# Patient Record
Sex: Female | Born: 1965 | Race: Black or African American | Hispanic: No | Marital: Single | Smoking: Former smoker
Health system: Southern US, Community
[De-identification: ages and names within clinical notes are randomized; demographics above are authoritative.]

## PROBLEM LIST (undated history)

## (undated) DIAGNOSIS — E7800 Pure hypercholesterolemia, unspecified: Secondary | ICD-10-CM

## (undated) DIAGNOSIS — G4730 Sleep apnea, unspecified: Secondary | ICD-10-CM

## (undated) DIAGNOSIS — F209 Schizophrenia, unspecified: Secondary | ICD-10-CM

## (undated) DIAGNOSIS — I10 Essential (primary) hypertension: Secondary | ICD-10-CM

## (undated) HISTORY — PX: UMBILICAL HERNIA REPAIR: SHX196

---

## 2014-02-13 ENCOUNTER — Ambulatory Visit: Payer: Self-pay | Admitting: Internal Medicine

## 2014-03-07 DIAGNOSIS — F209 Schizophrenia, unspecified: Secondary | ICD-10-CM | POA: Insufficient documentation

## 2014-03-07 DIAGNOSIS — I10 Essential (primary) hypertension: Secondary | ICD-10-CM | POA: Insufficient documentation

## 2014-03-07 DIAGNOSIS — E7800 Pure hypercholesterolemia, unspecified: Secondary | ICD-10-CM | POA: Insufficient documentation

## 2014-03-07 DIAGNOSIS — F2089 Other schizophrenia: Secondary | ICD-10-CM | POA: Insufficient documentation

## 2014-10-13 DIAGNOSIS — G4730 Sleep apnea, unspecified: Secondary | ICD-10-CM | POA: Insufficient documentation

## 2014-10-13 DIAGNOSIS — Z1211 Encounter for screening for malignant neoplasm of colon: Secondary | ICD-10-CM | POA: Insufficient documentation

## 2014-10-13 DIAGNOSIS — K219 Gastro-esophageal reflux disease without esophagitis: Secondary | ICD-10-CM | POA: Insufficient documentation

## 2014-11-14 ENCOUNTER — Encounter: Payer: Self-pay | Admitting: *Deleted

## 2014-11-17 ENCOUNTER — Ambulatory Visit: Payer: Medicaid Other | Admitting: *Deleted

## 2014-11-17 ENCOUNTER — Encounter
Admission: RE | Disposition: A | Discharge status: Home Health | Payer: Self-pay | Source: Ambulatory Visit | Attending: Gastroenterology

## 2014-11-17 ENCOUNTER — Ambulatory Visit
Admission: RE | Admit: 2014-11-17 | Discharge: 2014-11-17 | Disposition: A | Discharge status: Home Health | Payer: Medicaid Other | Source: Ambulatory Visit | Attending: Gastroenterology | Admitting: Gastroenterology

## 2014-11-17 DIAGNOSIS — G4730 Sleep apnea, unspecified: Secondary | ICD-10-CM | POA: Diagnosis not present

## 2014-11-17 DIAGNOSIS — K621 Rectal polyp: Secondary | ICD-10-CM | POA: Diagnosis not present

## 2014-11-17 DIAGNOSIS — Z1211 Encounter for screening for malignant neoplasm of colon: Secondary | ICD-10-CM | POA: Insufficient documentation

## 2014-11-17 DIAGNOSIS — K635 Polyp of colon: Secondary | ICD-10-CM | POA: Diagnosis not present

## 2014-11-17 DIAGNOSIS — Z79899 Other long term (current) drug therapy: Secondary | ICD-10-CM | POA: Diagnosis not present

## 2014-11-17 DIAGNOSIS — E669 Obesity, unspecified: Secondary | ICD-10-CM | POA: Insufficient documentation

## 2014-11-17 DIAGNOSIS — E7800 Pure hypercholesterolemia, unspecified: Secondary | ICD-10-CM | POA: Insufficient documentation

## 2014-11-17 DIAGNOSIS — Z6835 Body mass index (BMI) 35.0-35.9, adult: Secondary | ICD-10-CM | POA: Insufficient documentation

## 2014-11-17 DIAGNOSIS — F209 Schizophrenia, unspecified: Secondary | ICD-10-CM | POA: Diagnosis not present

## 2014-11-17 DIAGNOSIS — I10 Essential (primary) hypertension: Secondary | ICD-10-CM | POA: Insufficient documentation

## 2014-11-17 HISTORY — DX: Essential (primary) hypertension: I10

## 2014-11-17 HISTORY — PX: COLONOSCOPY WITH PROPOFOL: SHX5780

## 2014-11-17 HISTORY — DX: Pure hypercholesterolemia, unspecified: E78.00

## 2014-11-17 HISTORY — DX: Sleep apnea, unspecified: G47.30

## 2014-11-17 HISTORY — DX: Schizophrenia, unspecified: F20.9

## 2014-11-17 SURGERY — COLONOSCOPY WITH PROPOFOL
Anesthesia: General

## 2014-11-17 MED ORDER — SODIUM CHLORIDE 0.9 % IV SOLN: INTRAVENOUS | Status: DC

## 2014-11-17 MED ORDER — MIDAZOLAM HCL 2 MG/2ML IJ SOLN
INTRAMUSCULAR | Status: DC | PRN
  Administered 2014-11-17: 1 mg via INTRAVENOUS

## 2014-11-17 MED ORDER — PROPOFOL 500 MG/50ML IV EMUL
INTRAVENOUS | Status: DC | PRN
  Administered 2014-11-17: 120 ug/kg/min via INTRAVENOUS

## 2014-11-17 MED ORDER — SODIUM CHLORIDE 0.9 % IV SOLN
INTRAVENOUS | Status: DC
  Administered 2014-11-17: 1000 mL via INTRAVENOUS

## 2014-11-17 MED ORDER — FENTANYL CITRATE (PF) 100 MCG/2ML IJ SOLN
INTRAMUSCULAR | Status: DC | PRN
  Administered 2014-11-17: 50 ug via INTRAVENOUS

## 2014-11-17 NOTE — Transfer of Care (Signed)
Immediate Anesthesia Transfer of Care Note  Patient: Kerri Clark  Procedure(s) Performed: Procedure(s): COLONOSCOPY WITH PROPOFOL (N/A)  Patient Location: Endoscopy Unit  Anesthesia Type:General  Level of Consciousness: awake, alert , oriented and patient cooperative  Airway & Oxygen Therapy: Patient Spontanous Breathing and Patient connected to nasal cannula oxygen  Post-op Assessment: Report given to RN, Post -op Vital signs reviewed and stable and Patient moving all extremities X 4  Post vital signs: Reviewed and stable  Last Vitals:  Filed Vitals:   11/17/14 1415  BP: 134/85  Pulse: 78  Temp: 37.3 C  Resp: 20    Complications: No apparent anesthesia complications

## 2014-11-17 NOTE — Anesthesia Postprocedure Evaluation (Signed)
  Anesthesia Post-op Note  Patient: Kerri Clark  Procedure(s) Performed: Procedure(s): COLONOSCOPY WITH PROPOFOL (N/A)  Anesthesia type:General  Patient location: PACU  Post pain: Pain level controlled  Post assessment: Post-op Vital signs reviewed, Patient's Cardiovascular Status Stable, Respiratory Function Stable, Patent Airway and No signs of Nausea or vomiting  Post vital signs: Reviewed and stable  Last Vitals:  Filed Vitals:   11/17/14 1606  BP: 117/95  Pulse: 65  Temp:   Resp: 16    Level of consciousness: awake, alert  and patient cooperative  Complications: No apparent anesthesia complications

## 2014-11-17 NOTE — Anesthesia Preprocedure Evaluation (Signed)
Anesthesia Evaluation  Patient identified by MRN, date of birth, ID band Patient awake    Reviewed: Allergy & Precautions, NPO status , Patient's Chart, lab work & pertinent test results  Airway Mallampati: II  TM Distance: >3 FB Neck ROM: Full    Dental  (+) Teeth Intact   Pulmonary sleep apnea ,  Not using CPAP as it irritates her skin.   Pulmonary exam normal        Cardiovascular Exercise Tolerance: Good hypertension, Pt. on medications Normal cardiovascular exam     Neuro/Psych    GI/Hepatic negative GI ROS,   Endo/Other    Renal/GU      Musculoskeletal   Abdominal (+) + obese,   Peds  Hematology   Anesthesia Other Findings   Reproductive/Obstetrics                             Anesthesia Physical Anesthesia Plan  ASA: III  Anesthesia Plan: General   Post-op Pain Management:    Induction: Intravenous  Airway Management Planned: Nasal Cannula  Additional Equipment:   Intra-op Plan:   Post-operative Plan:   Informed Consent: I have reviewed the patients History and Physical, chart, labs and discussed the procedure including the risks, benefits and alternatives for the proposed anesthesia with the patient or authorized representative who has indicated his/her understanding and acceptance.     Plan Discussed with: CRNA  Anesthesia Plan Comments:         Anesthesia Quick Evaluation

## 2014-11-17 NOTE — Op Note (Addendum)
[facility] Patient Name: Kerri Clark Procedure Date: 11/17/2014 3:09 PM MRN: [id number] Account #: [id number] Date of Birth: 08-11-1965 Admit Type: Outpatient Age: 49 Room: [facility] ENDO ROOM 3 Gender: Female Note Status: Supervisor Override Procedure:         Colonoscopy Indications:       Screening for colorectal malignant neoplasm, This is the                     patient's first colonoscopy Providers:         Christena Deem, MD Referring MD:      Kerri Loveless, MD (Referring MD) Medicines:         Monitored Anesthesia Care Complications:     No immediate complications. Procedure:         Pre-Anesthesia Assessment:                    - ASA Grade Assessment: III - A patient with severe                     systemic disease.                    After obtaining informed consent, the colonoscope was                     passed under direct vision. Throughout the procedure, the                     patient's blood pressure, pulse, and oxygen saturations                     were monitored continuously. The Colonoscope was                     introduced through the anus and advanced to the the cecum,                     identified by appendiceal orifice and ileocecal valve. The                     quality of the bowel preparation was good. The colonoscopy                     was performed without difficulty. The patient tolerated                     the procedure well. Findings:      Two sessile polyps were found in the sigmoid colon. The polyps were 2 mm       in size. These polyps were removed with a cold biopsy forceps. Resection       and retrieval were complete.      Three sessile polyps were found in the distal sigmoid colon. The polyps       were 1 to 3 mm in size. These polyps were removed with a cold biopsy       forceps. Resection and retrieval were complete.      Four sessile polyps were found in the rectum. The polyps were 1 to 3 mm     in size. These polyps were removed with a cold biopsy forceps. Resection       and retrieval were complete.      The exam was otherwise normal throughout the examined colon.  The digital rectal exam was normal. Impression:        - Two 2 mm polyps in the sigmoid colon. Resected and                     retrieved.                    - Three 1 to 3 mm polyps in the distal sigmoid colon.                     Resected and retrieved.                    - Four 1 to 3 mm polyps in the rectum. Resected and                     retrieved. Recommendation:    - Await pathology results.                    - Telephone GI clinic for pathology results in 1 week. Procedure Code(s): --- Professional ---                    [phone], Colonoscopy, flexible; with biopsy, single or                     multiple Diagnosis Code(s): --- Professional ---                    V76.51, Special screening for malignant neoplasms of colon                    211.3, Benign neoplasm of colon                    569.0, Anal and rectal polyp CPT copyright 2014 American Medical Association. All rights reserved. The codes documented in this report are preliminary and upon coder review may  be revised to meet current compliance requirements. Christena DeemMartin U Skulskie, MD 11/17/2014 3:40:38 PM This report has been signed electronically. Number of Addenda: 0 Note Initiated On: 11/17/2014 3:09 PM Scope Withdrawal Time: 0 hours 14 minutes 59 seconds  Total Procedure Duration: 0 hours 22 minutes 32 seconds       [facility]

## 2014-11-17 NOTE — H&P (Signed)
Outpatient short stay form Pre-procedure 11/17/2014 3:04 PM Kerri DeemMartin U Skulskie MD  Primary Physician: Dr. Yves DillNeelam Khan  Reason for visit:  Screening colonoscopy  History of present illness:  Patient is a 49 year old female presenting today for her first colonoscopy. She tolerated her prep well. She takes no aspirin products or blood thinners.    Current facility-administered medications:  .  0.9 %  sodium chloride infusion, , Intravenous, Continuous, Kerri DeemMartin U Skulskie, MD, Last Rate: 20 mL/hr at 11/17/14 1413, 1,000 mL at 11/17/14 1413 .  0.9 %  sodium chloride infusion, , Intravenous, Continuous, Kerri DeemMartin U Skulskie, MD  Prescriptions prior to admission  Medication Sig Dispense Refill Last Dose  . amLODipine (NORVASC) 10 MG tablet Take 10 mg by mouth daily.   11/17/2014 at 630am  . diphenhydrAMINE (BENADRYL) 50 MG tablet Take 50 mg by mouth daily at 10 pm.   11/16/2014  . lisinopril (PRINIVIL,ZESTRIL) 5 MG tablet Take 5 mg by mouth daily.   11/17/2014 at 630am  . lurasidone (LATUDA) 80 MG TABS tablet Take 80 mg by mouth daily with breakfast.   11/16/2014 at 100pm  . pediatric multivitamin-fluoride (POLY-VI-FLOR) 0.25 MG chewable tablet Chew 1 tablet by mouth daily.   11/17/2014 at Unknown time     No Known Allergies   Past Medical History  Diagnosis Date  . Hypercholesterolemia   . Hypertension   . Schizophrenia (HCC)   . Sleep apnea     Review of systems:      Physical Exam    Heart and lungs: Regular rate and rhythm without rub or gallop, lungs are bilaterally clear    HEENT: Normocephalic atraumatic eyes are anicteric    Other:     Pertinant exam for procedure: Soft nontender nondistended bowel sounds positive normoactive    Planned proceedures: Colonoscopy and indicated procedures. I have discussed the risks benefits and complications of procedures to include not limited to bleeding, infection, perforation and the risk of sedation and the patient wishes to  proceed.    Kerri DeemMartin U Skulskie, MD Gastroenterology 11/17/2014  3:04 PM

## 2014-11-18 ENCOUNTER — Encounter: Payer: Self-pay | Admitting: Gastroenterology

## 2014-11-19 LAB — SURGICAL PATHOLOGY

## 2015-02-03 ENCOUNTER — Other Ambulatory Visit: Payer: Self-pay | Admitting: Internal Medicine

## 2015-02-03 DIAGNOSIS — Z1231 Encounter for screening mammogram for malignant neoplasm of breast: Secondary | ICD-10-CM

## 2015-02-23 ENCOUNTER — Ambulatory Visit: Payer: Medicaid Other

## 2015-02-24 ENCOUNTER — Ambulatory Visit
Admission: RE | Admit: 2015-02-24 | Discharge: 2015-02-24 | Disposition: A | Discharge status: Home / Self Care | Payer: Medicaid Other | Source: Ambulatory Visit | Attending: Internal Medicine | Admitting: Internal Medicine

## 2015-02-24 DIAGNOSIS — Z1231 Encounter for screening mammogram for malignant neoplasm of breast: Secondary | ICD-10-CM | POA: Insufficient documentation

## 2015-03-02 ENCOUNTER — Encounter: Payer: Self-pay | Admitting: Dietician

## 2015-03-02 ENCOUNTER — Encounter: Payer: Medicaid Other | Attending: Internal Medicine | Admitting: Dietician

## 2015-03-02 VITALS — Ht 63.0 in | Wt 208.7 lb

## 2015-03-02 DIAGNOSIS — E669 Obesity, unspecified: Secondary | ICD-10-CM | POA: Diagnosis present

## 2015-03-02 DIAGNOSIS — R7303 Prediabetes: Secondary | ICD-10-CM | POA: Insufficient documentation

## 2015-03-02 DIAGNOSIS — I10 Essential (primary) hypertension: Secondary | ICD-10-CM | POA: Diagnosis present

## 2015-03-02 NOTE — Patient Instructions (Signed)
   Eat a small meal or snack every 4-5 hours during the day. Make sure to have some protein and healthy carb; such as oatmeal and boiled eggs, or whole wheat sandwich with peanut butter, or fruit and lowfat cheese, or yogurt.   Control food portions: fist-size or less of starchy foods, palm-size or less for meats, but have generous portions of vegetables.    Drink sugar free drinks. You can add Equal or other sweetener instead of sugar in drinks.   If you want to try bottled green tea, make sure it's diet. Also drink G2 Gatorade.   Keep up with exercising 3 or more times a week.

## 2015-03-02 NOTE — Progress Notes (Signed)
Medical Nutrition Therapy: Visit start time: 1330  end time: 1430  Assessment:  Diagnosis: pre-Diabetes, HTN, obesity Past medical history: sleep apnea, frequent heartburn  Psychosocial issues/ stress concerns: scizophrenia; patient reports low stress level Preferred learning method:  . Auditory  Current weight: 208.7lbs with jacket and shoes  Height: 5'3" Medications, supplements: reviewed list in chart with patient  Progress and evaluation: Patient reports recent weight gain, due to peri-menopause and medications; reports weight of 201 about 2-3 weeks ago.          Has started going to gym           Has decreased fried foods, use of butter         Boyfriend does a lot of the cooking, but tends to fry foods more.  Physical activity: walking/ cardio at gym for 30-45 minutes, 3-4 times a week  Dietary Intake:  Usual eating pattern includes 1-2 meals and 1-2 snacks per day. Dining out frequency: 2-3 meals per week.  Breakfast: often oatmeal. Rarely big breakfast eggs, toast, grits, sausage. May be as late as 12. Sometimes drinks coffee first.  Snack: none Lunch: none or eats breakfast late Snack: cookies, cheez-its, chips (stopped for 2 years due to BP) Supper: 2:30-3pm fish (fried), steak, potatoes, mac and cheese, vegetables: collards, salad Snack: none; stopped eating ice cream and other sweets Beverages: water, coffee, some pepsi or gatorade.   Nutrition Care Education: Topics covered: diabetes prevention, weight manageent Basic nutrition: basic food groups, appropriate nutrient balance, appropriate meal and snack schedule, general nutrition guidelines    Weight control: benefits of weight control, behavioral changes for weight loss: food portions, basic meal planning using plate method and menus.  Diabetes: appropriate carb intake and balance, importance of sugar free beverages, controlling portions of starches and choosing high-fiber foods.  Hypertension: identifying high  sodium foods, identifying food sources of potassium, magnesium Other lifestyle changes:  benefits of tracking food intake  Nutritional Diagnosis:  [place]-3.3 Overweight/obesity As related to history of excess calories, inactivity, perimenopause, medications.  As evidenced by patient report, high BMI.  Intervention: Instruction as noted above.   Set goals with patient input.    Patient has been making some changes to decrease calories.    Patient is ready to work on additional diet changes if not too overwhelming.  Education Materials given:  . General diet guidelines for Diabetes . Food lists/ Planning A Balanced Meal . Sample meal pattern/ menus: Quick and Healthy Meal Ideas . Plate method meal planner . Goals/ instructions  Learner/ who was taught:  . Patient   Level of understanding: . Partial understanding; needs review/ practice  Demonstrated degree of understanding via:   Teach back Learning barriers: . None  Willingness to learn/ readiness for change: . Eager, change in progress  Monitoring and Evaluation:  Dietary intake, exercise, BP and BG control, and body weight      follow up: 04/06/15

## 2015-04-06 ENCOUNTER — Encounter: Payer: Medicaid Other | Attending: Internal Medicine | Admitting: Dietician

## 2015-04-06 VITALS — Wt 202.6 lb

## 2015-04-06 DIAGNOSIS — I10 Essential (primary) hypertension: Secondary | ICD-10-CM | POA: Diagnosis present

## 2015-04-06 DIAGNOSIS — R7303 Prediabetes: Secondary | ICD-10-CM | POA: Insufficient documentation

## 2015-04-06 DIAGNOSIS — E669 Obesity, unspecified: Secondary | ICD-10-CM | POA: Insufficient documentation

## 2015-04-06 NOTE — Progress Notes (Signed)
Medical Nutrition Therapy: Visit start time: 5110  end time: 1400   Assessment:  Diagnosis: HTN, pre-diabetes, obesity Medical history changes: no changes Psychosocial issues/ stress concerns: scizophrenia  Current weight: 202.6lbs  Height: 5'3" Medications, supplement changes: no changes Progress and evaluation: Weight loss of 6.1lbs since previous appt. On 03/02/15.           Patient reports better portion control, and avoiding sugary beverages.         She states she sometimes uses her fiance's BP monitor to test BP, with results usually 120s/60-80s.          She also reports significant improvement in heartburn and is feeling much better.            Physical activity: gym for 45-50 minutes 2-3 times per week; walking average 4 times per week  Dietary Intake:  Usual eating pattern includes 3 meals and 1-2 snacks per day. Dining out frequency: not assessed today  Breakfast: sugar free, low sodium oatmeal, 2packs with 1 boiled egg. Today 2 eggs with cheese and 2slices toast.  Snack: yogurt or pudding once a day, or a few cheez-its, rarely chips Lunch: salad, using less dressing now. Cucumber, shredded cheese, lettuce, garlic croutons, french and caesar dressing. 3/19 spaghetti Snack: some sugar free jello with fruit cocktail, occasionally small pudding cup.  Supper: 2pcs chicken wings, whole small can green veg, mac and cheese. Eating 1/2 of restaurant meal when dining out.  Snack: none Beverages: no sugary drinks since last RD visit' mostly water, some G2 gatorade.   Nutrition Care Education: Topics covered: weight management, Hypertension Weight control: benefits of weight control such as improved BPs and heartburn.  Diabetes: appropriate carb intake and balance Hypertension: identifying food sources of Calcium, potassium, magnesium  Nutritional Diagnosis:  [place]-3.3 Overweight/obesity As related to history of excess caloric intake.  As evidenced by patient report.  Intervention:  Discussion as noted above.   Patient has met goals established at previous visit.    Set new goals with her input.    Commended patient for changes made thus far.    Set follow-up appt for September, patient wished to return after about 6 months.  Education Materials given:  Marland Kitchen Goals/ instructions  Learner/ who was taught:  . Patient   Level of understanding: Marland Kitchen Verbalizes/ demonstrates competency  Demonstrated degree of understanding via:   Teach back Learning barriers: . None  Willingness to learn/ readiness for change: . Eager, change in progress  Monitoring and Evaluation:  Dietary intake, exercise, BP control, and body weight      follow up: 10/05/15

## 2015-04-06 NOTE — Patient Instructions (Signed)
   Try some whole grain cereal, such as Special K, Wheaties, Mini wheats, cheerios, or bran cereal.   You can mix Special K or cheerios with bran buds or Bran flakes to increase the fiber.   Keep eating plenty of vegetables each day, good job!  Include some fruit 3 times a day. You can try finding [employer] oranges [facility] for a sweet, full-flavor orange.   Keep up your great work!

## 2015-10-05 ENCOUNTER — Ambulatory Visit: Payer: Medicaid Other | Admitting: Dietician

## 2015-11-23 ENCOUNTER — Encounter: Payer: Medicaid Other | Attending: Internal Medicine | Admitting: Dietician

## 2015-11-23 VITALS — Ht 63.0 in | Wt 197.7 lb

## 2015-11-23 DIAGNOSIS — Z6835 Body mass index (BMI) 35.0-35.9, adult: Secondary | ICD-10-CM

## 2015-11-23 DIAGNOSIS — E669 Obesity, unspecified: Secondary | ICD-10-CM | POA: Diagnosis not present

## 2015-11-23 DIAGNOSIS — R7303 Prediabetes: Secondary | ICD-10-CM | POA: Insufficient documentation

## 2015-11-23 DIAGNOSIS — Z713 Dietary counseling and surveillance: Secondary | ICD-10-CM | POA: Insufficient documentation

## 2015-11-23 DIAGNOSIS — E6609 Other obesity due to excess calories: Secondary | ICD-10-CM

## 2015-11-23 DIAGNOSIS — I10 Essential (primary) hypertension: Secondary | ICD-10-CM | POA: Insufficient documentation

## 2015-11-23 NOTE — Progress Notes (Signed)
Medical Nutrition Therapy: Visit start time: 1315  end time: 1345  Assessment:  Diagnosis: obesity, pre-diabetes Medical history changes: no changes per patient Psychosocial issues/ stress concerns: none; taking medication for scizophrenia  Current weight: 197.7lbs  Height: 5'3" Medications, supplement changes: no changes per patient  Progress and evaluation: Weight loss of about 5lbs since previous visit on 04/06/15. She feels some discouragement over slow weight loss, but also reports decrease in physical activity in recent months. She has also had some desserts recently, but has now stopped. She reports trying new foods more easily, she has developed a taste for leafy greens and some fruits.   Physical activity: has recently resumed gym visits 2-3 times a week. Will plan to resume walking on alternate days.   Dietary Intake:  Usual eating pattern includes 3 meals and 1 snacks per day. Dining out frequency: 0-1 meals per week twice in past month.  Breakfast: yogurt with blueberries, Wheaties with 2%milk, sometimes almond milk Snack: usually none Lunch: burger with chili and slaw; salads,  Snack: chips and dip, occasionally lemon meringue pie Supper: varies; beans, rice, chicken, vegetables, pasta; salad and lasagna at cafeteria Snack: sometimes  Beverages: water, coffee with breakfast  Nutrition Care Education: Topics covered: weight management Basic nutrition: reviewed appropriate nutrient balance Weight control: determining reasonable weight goal, benefits of tracking food intake, role of physical activity in weight management. Reviewed patient's progress since 04/06/15.   Nutritional Diagnosis:  [place]-3.3 Overweight/obesity As related to history of excess calories, effects of medications, inactivity.  As evidenced by patient report.  Intervention: Discussion as noted above.   Commended patient for progress made and encouraged her to give herself credit for losing 11lbs since 03/02/15.     Will contact patient to schedule follow-up after checking on insurance coverage.    Patient would benefit from regular monthly RD visits.   Education Materials given:  Marland Kitchen. Goals/ instructions  Learner/ who was taught:  . Patient   Level of understanding: Marland Kitchen. Verbalizes/ demonstrates competency  Demonstrated degree of understanding via:   Teach back Learning barriers: . None  Willingness to learn/ readiness for change: . Eager, change in progress  Monitoring and Evaluation:  Dietary intake, exercise, and body weight      follow up: to be determined

## 2015-11-23 NOTE — Patient Instructions (Signed)
   Keep a diary of what you eat and how much you eat  Goal for portions: 1/3 - 1 cup of starches (1 handful up to 1 fist-size)    Palm-size (or less) portions of meats    Generous portions of low-carb vegetables.  Keep up your regular exercise, including some daily walking.

## 2016-01-19 ENCOUNTER — Other Ambulatory Visit: Payer: Self-pay | Admitting: Internal Medicine

## 2016-03-08 ENCOUNTER — Other Ambulatory Visit: Payer: Self-pay | Admitting: Internal Medicine

## 2016-03-08 DIAGNOSIS — Z1231 Encounter for screening mammogram for malignant neoplasm of breast: Secondary | ICD-10-CM

## 2016-03-17 DIAGNOSIS — I2089 Other forms of angina pectoris: Secondary | ICD-10-CM | POA: Insufficient documentation

## 2016-03-17 DIAGNOSIS — I208 Other forms of angina pectoris: Secondary | ICD-10-CM | POA: Insufficient documentation

## 2016-04-01 ENCOUNTER — Ambulatory Visit
Admission: RE | Admit: 2016-04-01 | Discharge: 2016-04-01 | Disposition: A | Discharge status: Home / Self Care | Payer: Medicaid Other | Source: Ambulatory Visit | Attending: Internal Medicine | Admitting: Internal Medicine

## 2016-04-01 DIAGNOSIS — Z1231 Encounter for screening mammogram for malignant neoplasm of breast: Secondary | ICD-10-CM | POA: Insufficient documentation

## 2016-12-05 ENCOUNTER — Ambulatory Visit: Payer: Self-pay | Admitting: Internal Medicine

## 2017-04-28 ENCOUNTER — Other Ambulatory Visit: Payer: Self-pay | Admitting: Internal Medicine

## 2017-04-28 DIAGNOSIS — Z1231 Encounter for screening mammogram for malignant neoplasm of breast: Secondary | ICD-10-CM

## 2017-05-17 ENCOUNTER — Ambulatory Visit
Admission: RE | Admit: 2017-05-17 | Discharge: 2017-05-17 | Disposition: A | Discharge status: Home / Self Care | Payer: Medicaid Other | Source: Ambulatory Visit | Attending: Internal Medicine | Admitting: Internal Medicine

## 2017-05-17 DIAGNOSIS — Z1231 Encounter for screening mammogram for malignant neoplasm of breast: Secondary | ICD-10-CM | POA: Diagnosis present

## 2018-04-09 ENCOUNTER — Ambulatory Visit: Payer: Medicaid Other | Admitting: Podiatry

## 2018-04-09 ENCOUNTER — Ambulatory Visit (INDEPENDENT_AMBULATORY_CARE_PROVIDER_SITE_OTHER): Payer: Medicaid Other

## 2018-04-09 ENCOUNTER — Other Ambulatory Visit: Payer: Self-pay

## 2018-04-09 ENCOUNTER — Other Ambulatory Visit: Payer: Self-pay | Admitting: Podiatry

## 2018-04-09 ENCOUNTER — Encounter: Payer: Self-pay | Admitting: Podiatry

## 2018-04-09 ENCOUNTER — Ambulatory Visit (INDEPENDENT_AMBULATORY_CARE_PROVIDER_SITE_OTHER): Payer: Medicaid Other | Admitting: Podiatry

## 2018-04-09 DIAGNOSIS — M722 Plantar fascial fibromatosis: Secondary | ICD-10-CM

## 2018-04-09 DIAGNOSIS — M7742 Metatarsalgia, left foot: Secondary | ICD-10-CM

## 2018-04-09 NOTE — Progress Notes (Signed)
This patient presents the office with chief complaint of painful feet for approximately 3 to 4 months.  She says that her left foot is worse than her right foot.  She points to the area over the metatarsals on the left foot at the site of her severe pain she says that the pain worsens with activity.  She denies any history of trauma or injury to the foot.  She has provided no self treatment nor sought any professional help.  She presents the office today for an evaluation and treatment of her painful feet.  Vascular  Dorsalis pedis and posterior tibial pulses are palpable  B/L.  Capillary return  WNL.  Temperature gradient is  WNL.  Skin turgor  WNL  Sensorium  Senn Weinstein monofilament wire  WNL. Normal tactile sensation.  Nail Exam  Patient has normal nails with no evidence of bacterial or fungal infection.  Orthopedic  Exam  Muscle tone and muscle strength  WNL.  No limitations of motion feet  B/L.  No crepitus or joint effusion noted.  Foot type is unremarkable and digits show no abnormalities.  No evidence of any redness or swelling noted on the left forefoot.  No localized palpable pain noted through the metatarsals on the left and right foot.  Patient does have a flexible pes planus foot type on the left foot greater in the right foot.  Skin  No open lesions.  Normal skin texture and turgor.  Metatarsalgia  B/L  IE.  X-rays were taken revealed no evidence of any bony pathology both feet.  Discussed this condition with this patient.  I told her that there is no evidence of any bony pathology at this time.  I recommended power step insoles which she did not take.  I also told her she should take medication to help with her pain in the p.m.Marland Kitchen  Patient to return to the office in the future.  RTC prn.  Helane Gunther DPM

## 2018-04-10 ENCOUNTER — Encounter: Payer: Self-pay | Admitting: Podiatry

## 2018-04-26 ENCOUNTER — Other Ambulatory Visit: Payer: Self-pay | Admitting: Internal Medicine

## 2018-04-26 DIAGNOSIS — Z1231 Encounter for screening mammogram for malignant neoplasm of breast: Secondary | ICD-10-CM

## 2018-07-02 ENCOUNTER — Ambulatory Visit
Admission: RE | Admit: 2018-07-02 | Discharge: 2018-07-02 | Disposition: A | Discharge status: Home / Self Care | Payer: Medicaid Other | Source: Ambulatory Visit | Attending: Internal Medicine | Admitting: Internal Medicine

## 2018-07-02 ENCOUNTER — Other Ambulatory Visit: Payer: Self-pay

## 2018-07-02 DIAGNOSIS — Z1231 Encounter for screening mammogram for malignant neoplasm of breast: Secondary | ICD-10-CM | POA: Diagnosis not present

## 2019-05-31 ENCOUNTER — Other Ambulatory Visit: Payer: Self-pay | Admitting: Family

## 2019-05-31 DIAGNOSIS — Z1231 Encounter for screening mammogram for malignant neoplasm of breast: Secondary | ICD-10-CM

## 2019-07-04 ENCOUNTER — Encounter: Payer: Self-pay | Admitting: Radiology

## 2019-07-04 ENCOUNTER — Ambulatory Visit
Admission: RE | Admit: 2019-07-04 | Discharge: 2019-07-04 | Disposition: A | Discharge status: Home / Self Care | Payer: Medicaid Other | Source: Ambulatory Visit | Attending: Family | Admitting: Family

## 2019-07-04 DIAGNOSIS — Z1231 Encounter for screening mammogram for malignant neoplasm of breast: Secondary | ICD-10-CM | POA: Diagnosis present

## 2020-05-26 ENCOUNTER — Other Ambulatory Visit: Payer: Self-pay | Admitting: Family

## 2020-05-26 DIAGNOSIS — Z1231 Encounter for screening mammogram for malignant neoplasm of breast: Secondary | ICD-10-CM

## 2020-07-06 ENCOUNTER — Other Ambulatory Visit: Payer: Self-pay

## 2020-07-06 ENCOUNTER — Ambulatory Visit
Admission: RE | Admit: 2020-07-06 | Discharge: 2020-07-06 | Disposition: A | Discharge status: Home / Self Care | Payer: Medicaid Other | Source: Ambulatory Visit | Attending: Family | Admitting: Family

## 2020-07-06 DIAGNOSIS — Z1231 Encounter for screening mammogram for malignant neoplasm of breast: Secondary | ICD-10-CM | POA: Diagnosis present

## 2020-12-03 ENCOUNTER — Other Ambulatory Visit (INDEPENDENT_AMBULATORY_CARE_PROVIDER_SITE_OTHER): Payer: Self-pay | Admitting: Nurse Practitioner

## 2020-12-03 DIAGNOSIS — I739 Peripheral vascular disease, unspecified: Secondary | ICD-10-CM

## 2020-12-04 ENCOUNTER — Ambulatory Visit (INDEPENDENT_AMBULATORY_CARE_PROVIDER_SITE_OTHER): Payer: Medicaid Other

## 2020-12-04 ENCOUNTER — Ambulatory Visit (INDEPENDENT_AMBULATORY_CARE_PROVIDER_SITE_OTHER): Payer: Medicaid Other | Admitting: Nurse Practitioner

## 2020-12-04 ENCOUNTER — Other Ambulatory Visit: Payer: Self-pay

## 2020-12-04 VITALS — BP 135/90 | HR 84 | Ht 63.0 in | Wt 203.0 lb

## 2020-12-04 DIAGNOSIS — I739 Peripheral vascular disease, unspecified: Secondary | ICD-10-CM | POA: Diagnosis not present

## 2020-12-04 DIAGNOSIS — M7989 Other specified soft tissue disorders: Secondary | ICD-10-CM

## 2020-12-07 ENCOUNTER — Encounter (INDEPENDENT_AMBULATORY_CARE_PROVIDER_SITE_OTHER): Payer: Self-pay | Admitting: Nurse Practitioner

## 2020-12-07 NOTE — Progress Notes (Addendum)
Subjective:    Patient ID: Kerri Clark, female    DOB: 26-Apr-1965, 55 y.o.   MRN: [id number] Chief Complaint  Patient presents with   New Patient (Initial Visit)    NP faxed/[name] [phone] PVD and Edema in BLE     Kerri Clark is a 55 year old female that is seen  for evaluation of leg swelling. The patient first noticed the swelling remotely but is now concerned because of a significant increase in the overall edema. The swelling is associated with pain and discoloration. The patient notes that in the morning the legs are significantly improved but they steadily worsened throughout the course of the day.  There is no history of ulcerations associated with the swelling.   The patient denies any recent changes in their medications.  The patient has been wearing graduated compression, but no elevation  The patient has no had any past angiography, interventions or vascular surgery.  The patient denies a history of DVT or PE. There is no prior history of phlebitis. There is no history of primary lymphedema.  There is no history of radiation treatment to the groin or pelvis No history of malignancies. No history of trauma or groin or pelvic surgery. No history of foreign travel or parasitic infections area   Today the patient has an ABI of 1.14 on the left and one-point 3 0 on the right.  Triphasic tibial artery waveforms bilaterally with good toe waveforms bilaterally.     Review of Systems  Cardiovascular:  Positive for leg swelling.  All other systems reviewed and are negative.     Objective:   Physical Exam Vitals reviewed.  HENT:     Head: Normocephalic.  Cardiovascular:     Rate and Rhythm: Normal rate.     Pulses: Normal pulses.  Pulmonary:     Effort: Pulmonary effort is normal.  Musculoskeletal:     Right lower leg: 1+ Edema present.     Left lower leg: 1+ Edema present.  Skin:    General: Skin is warm and dry.  Neurological:     Mental  Status: She is alert and oriented to person, place, and time.  Psychiatric:        Mood and Affect: Mood normal.        Behavior: Behavior normal.        Thought Content: Thought content normal.        Judgment: Judgment normal.    BP 135/90   Pulse 84   Ht 5\' 3"  (1.6 m)   Wt 203 lb (92.1 kg)   LMP 11/17/2014   BMI 35.96 kg/m   Past Medical History:  Diagnosis Date   Hypercholesterolemia    Hypertension    Schizophrenia (HCC)    Sleep apnea     Social History   Socioeconomic History   Marital status: Single    Spouse name: Not on file   Number of children: Not on file   Years of education: Not on file   Highest education level: Not on file  Occupational History   Not on file  Tobacco Use   Smoking status: Former    Types: Cigarettes    Quit date: 01/17/2003    Years since quitting: 17.9   Smokeless tobacco: Not on file  Substance and Sexual Activity   Alcohol use: No    Alcohol/week: 0.0 standard drinks   Drug use: No   Sexual activity: Not on file  Other Topics Concern  Not on file  Social History Narrative   Not on file   Social Determinants of Health   Financial Resource Strain: Not on file  Food Insecurity: Not on file  Transportation Needs: Not on file  Physical Activity: Not on file  Stress: Not on file  Social Connections: Not on file  Intimate Partner Violence: Not on file    Past Surgical History:  Procedure Laterality Date   COLONOSCOPY WITH PROPOFOL N/A 11/17/2014   Procedure: COLONOSCOPY WITH PROPOFOL;  Surgeon: Christena Deem, MD;  Location: [facility] ENDOSCOPY;  Service: Endoscopy;  Laterality: N/A;   UMBILICAL HERNIA REPAIR      Family History  Problem Relation Age of Onset   Breast cancer Other     Allergies  Allergen Reactions   Atorvastatin Other (See Comments)   Rosuvastatin     Note: muscle cramps    No flowsheet data found.    CMP  No results found for: NA, K, CL, CO2, GLUCOSE, BUN, CREATININE, CALCIUM, PROT,  ALBUMIN, AST, ALT, ALKPHOS, BILITOT, GFRNONAA, GFRAA   No results found.     Assessment & Plan:   1. Leg swelling I have had a long discussion with the patient regarding swelling and why it  causes symptoms.  Patient will begin wearing graduated compression stockings class 1 (20-30 mmHg) on a daily basis a prescription was given. The patient will  beginning wearing the stockings first thing in the morning and removing them in the evening. The patient is instructed specifically not to sleep in the stockings.   In addition, behavioral modification will be initiated.  This will include frequent elevation, use of over the counter pain medications and exercise such as walking.  I have reviewed systemic causes for chronic edema such as liver, kidney and cardiac etiologies.  The patient denies problems with these organ systems.    Consideration for a lymph pump will also be made based upon the effectiveness of conservative therapy.  This would help to improve the edema control and prevent sequela such as ulcers and infections   Patient should undergo duplex ultrasound of the venous system to ensure that DVT or reflux is not present.  The patient will follow-up with me after the ultrasound.     Current Outpatient Medications on File Prior to Visit  Medication Sig Dispense Refill   amLODipine (NORVASC) 10 MG tablet Take 10 mg by mouth daily.     Bempedoic Acid-Ezetimibe (NEXLIZET) 180-10 MG TABS Nexlizet 180-10 MG Oral Tablet QTY: 90 tablet Days: 90 Refills: 2  Written: 12/02/19 Patient Instructions: 1 po qd     Calcium Carbonate-Vit D-Min (CALTRATE 600+D PLUS MINERALS) 600-800 MG-UNIT CHEW Caltrate 600+D Plus Minerals 600-800 MG-UNIT Oral Tablet Chewable QTY: 0 tablet Days: 0 Refills: 0  Written: 04/19/18 Patient Instructions:     cyanocobalamin 100 MCG tablet Vitamin B12 100 MCG Oral Tablet QTY: 0 tablet Days: 0 Refills: 0  Written: 07/26/16 Patient Instructions: qd     diphenhydrAMINE  (BENADRYL) 50 MG tablet Take 50 mg by mouth daily at 10 pm.     lisinopril (PRINIVIL,ZESTRIL) 5 MG tablet Take 5 mg by mouth daily.     lurasidone (LATUDA) 80 MG TABS tablet Take 80 mg by mouth daily with breakfast.     Multiple Vitamins-Minerals (MULTIVITAMIN ADULTS 50+) TABS Take by mouth.     omeprazole (PRILOSEC) 20 MG capsule Omeprazole 20 MG Oral Capsule Delayed Release QTY: 90 capsule Days: 90 Refills: 3  Written: 12/02/19 Patient Instructions: TAKE 1  CAPSULE  BY MOUTH EVERY MORNING, 30 MINUTES BEFORE ANY FOOD     pediatric multivitamin-fluoride (POLY-VI-FLOR) 0.25 MG chewable tablet Chew 1 tablet by mouth daily. Reported on 03/02/2015     polyethylene glycol powder (GLYCOLAX/MIRALAX) 17 GM/SCOOP powder As directed for colonic prep.     terbinafine (LAMISIL) 250 MG tablet TAKE ONE TABLET BY MOUTH EVERY DAY FOR 3 MONTHS  2   No current facility-administered medications on file prior to visit.    There are no Patient Instructions on file for this visit. No follow-ups on file.   Georgiana Spinner, NP

## 2021-03-12 ENCOUNTER — Other Ambulatory Visit: Payer: Self-pay

## 2021-03-12 ENCOUNTER — Ambulatory Visit (INDEPENDENT_AMBULATORY_CARE_PROVIDER_SITE_OTHER): Payer: Medicaid Other

## 2021-03-12 ENCOUNTER — Ambulatory Visit (INDEPENDENT_AMBULATORY_CARE_PROVIDER_SITE_OTHER): Payer: Medicaid Other | Admitting: Vascular Surgery

## 2021-03-12 ENCOUNTER — Other Ambulatory Visit (INDEPENDENT_AMBULATORY_CARE_PROVIDER_SITE_OTHER): Payer: Self-pay | Admitting: Nurse Practitioner

## 2021-03-12 VITALS — BP 135/84 | HR 81 | Ht 63.0 in | Wt 205.0 lb

## 2021-03-12 DIAGNOSIS — M7989 Other specified soft tissue disorders: Secondary | ICD-10-CM

## 2021-03-12 DIAGNOSIS — I10 Essential (primary) hypertension: Secondary | ICD-10-CM | POA: Diagnosis not present

## 2021-03-12 DIAGNOSIS — I890 Lymphedema, not elsewhere classified: Secondary | ICD-10-CM | POA: Diagnosis not present

## 2021-03-12 DIAGNOSIS — E7800 Pure hypercholesterolemia, unspecified: Secondary | ICD-10-CM | POA: Diagnosis not present

## 2021-03-12 NOTE — Assessment & Plan Note (Signed)
Venous study today demonstrated no evidence of deep venous thrombosis, superficial thrombophlebitis, or significant venous reflux in the left lower extremity. The patient has been diligently wearing her 20 to 30 mmHg compression socks, elevating her legs, and trying to be active for over 3 months now.  This has resulted in minimal improvement in her left lower extremity swelling.  She does not have significant venous disease.  She has stage II lymphedema with refractory swelling given to compression and elevation.  I would recommend a lymphedema pump for adjuvant therapy at this point.  I have discussed the pathophysiology and natural history of lymphedema.  We will try to obtain the pump and see her back in about 6 months.

## 2021-03-12 NOTE — Assessment & Plan Note (Signed)
blood pressure control important in reducing the progression of atherosclerotic disease. On appropriate oral medications.  

## 2021-03-12 NOTE — Assessment & Plan Note (Signed)
lipid control important in reducing the progression of atherosclerotic disease. Continue statin therapy  

## 2021-03-12 NOTE — Progress Notes (Signed)
MRN : [id number]  Kerri Clark is a 56 y.o. (January 13, 1966) female who presents with chief complaint of  Chief Complaint  Patient presents with   Follow-up    3 Mo F/U LLE ven REFLUX   .  History of Present Illness: Patient returns today in follow up of her left leg swelling.  She has been diligently wearing her compression stockings for about 3 months.  These may help slightly with swelling, but not significantly.  No open wounds or infection.  No fevers or chills.  Venous study today demonstrated no evidence of deep venous thrombosis, superficial thrombophlebitis, or significant venous reflux in the left lower extremity.  Current Outpatient Medications  Medication Sig Dispense Refill   amLODipine (NORVASC) 10 MG tablet Take 10 mg by mouth daily.     Bempedoic Acid-Ezetimibe (NEXLIZET) 180-10 MG TABS Nexlizet 180-10 MG Oral Tablet QTY: 90 tablet Days: 90 Refills: 2  Written: 12/02/19 Patient Instructions: 1 po qd     diphenhydrAMINE (BENADRYL) 50 MG tablet Take 50 mg by mouth daily at 10 pm.     lisinopril (PRINIVIL,ZESTRIL) 5 MG tablet Take 5 mg by mouth daily.     lurasidone (LATUDA) 80 MG TABS tablet Take 80 mg by mouth daily with breakfast.     Multiple Vitamins-Minerals (MULTIVITAMIN ADULTS 50+) TABS Take by mouth.     omeprazole (PRILOSEC) 20 MG capsule Omeprazole 20 MG Oral Capsule Delayed Release QTY: 90 capsule Days: 90 Refills: 3  Written: 12/02/19 Patient Instructions: TAKE 1 CAPSULE  BY MOUTH EVERY MORNING, 30 MINUTES BEFORE ANY FOOD     Calcium Carbonate-Vit D-Min (CALTRATE 600+D PLUS MINERALS) 600-800 MG-UNIT CHEW Caltrate 600+D Plus Minerals 600-800 MG-UNIT Oral Tablet Chewable QTY: 0 tablet Days: 0 Refills: 0  Written: 04/19/18 Patient Instructions: (Patient not taking: Reported on 03/12/2021)     cyanocobalamin 100 MCG tablet Vitamin B12 100 MCG Oral Tablet QTY: 0 tablet Days: 0 Refills: 0  Written: 07/26/16 Patient Instructions: qd (Patient not taking: Reported on  03/12/2021)     pediatric multivitamin-fluoride (POLY-VI-FLOR) 0.25 MG chewable tablet Chew 1 tablet by mouth daily. Reported on 03/02/2015 (Patient not taking: Reported on 03/12/2021)     polyethylene glycol powder (GLYCOLAX/MIRALAX) 17 GM/SCOOP powder As directed for colonic prep. (Patient not taking: Reported on 03/12/2021)     terbinafine (LAMISIL) 250 MG tablet TAKE ONE TABLET BY MOUTH EVERY DAY FOR 3 MONTHS (Patient not taking: Reported on 03/12/2021)  2   No current facility-administered medications for this visit.    Past Medical History:  Diagnosis Date   Hypercholesterolemia    Hypertension    Schizophrenia (HCC)    Sleep apnea     Past Surgical History:  Procedure Laterality Date   COLONOSCOPY WITH PROPOFOL N/A 11/17/2014   Procedure: COLONOSCOPY WITH PROPOFOL;  Surgeon: Christena Deem, MD;  Location: [facility] ENDOSCOPY;  Service: Endoscopy;  Laterality: N/A;   UMBILICAL HERNIA REPAIR       Social History   Tobacco Use   Smoking status: Former    Types: Cigarettes    Quit date: 01/17/2003    Years since quitting: 18.1  Substance Use Topics   Alcohol use: No    Alcohol/week: 0.0 standard drinks   Drug use: No       Family History  Problem Relation Age of Onset   Breast cancer Other      Allergies  Allergen Reactions   Atorvastatin Other (See Comments)   Rosuvastatin     Note:  muscle cramps     REVIEW OF SYSTEMS (Negative unless checked)  Constitutional: [] Weight loss  [] Fever  [] Chills Cardiac: [] Chest pain   [] Chest pressure   [] Palpitations   [] Shortness of breath when laying flat   [] Shortness of breath at rest   [] Shortness of breath with exertion. Vascular:  [] Pain in legs with walking   [] Pain in legs at rest   [] Pain in legs when laying flat   [] Claudication   [] Pain in feet when walking  [] Pain in feet at rest  [] Pain in feet when laying flat   [] History of DVT   [] Phlebitis   [x] Swelling in legs   [] Varicose veins   [] Non-healing  ulcers Pulmonary:   [] Uses home oxygen   [] Productive cough   [] Hemoptysis   [] Wheeze  [] COPD   [] Asthma Neurologic:  [] Dizziness  [] Blackouts   [] Seizures   [] History of stroke   [] History of TIA  [] Aphasia   [] Temporary blindness   [] Dysphagia   [] Weakness or numbness in arms   [] Weakness or numbness in legs Musculoskeletal:  [x] Arthritis   [] Joint swelling   [x] Joint pain   [] Low back pain Hematologic:  [] Easy bruising  [] Easy bleeding   [] Hypercoagulable state   [] Anemic   Gastrointestinal:  [] Blood in stool   [] Vomiting blood  [] Gastroesophageal reflux/heartburn   [] Abdominal pain Genitourinary:  [] Chronic kidney disease   [] Difficult urination  [] Frequent urination  [] Burning with urination   [] Hematuria Skin:  [] Rashes   [] Ulcers   [] Wounds Psychological:  [] History of anxiety   []  History of major depression.  Physical Examination  BP 135/84    Pulse 81    Ht 5\' 3"  (1.6 m)    Wt 205 lb (93 kg)    LMP 11/17/2014    BMI 36.31 kg/m  Gen:  WD/WN, NAD Head: [place]/AT, No temporalis wasting. Ear/Nose/Throat: Hearing grossly intact, nares w/o erythema or drainage Eyes: Conjunctiva clear. Sclera non-icteric Neck: Supple.  Trachea midline Pulmonary:  Good air movement, no use of accessory muscles.  Cardiac: RRR, no JVD Vascular:  Vessel Right Left  Radial Palpable Palpable           Musculoskeletal: M/S 5/5 throughout.  No deformity or atrophy. 2+ LLE edema. Neurologic: Sensation grossly intact in extremities.  Symmetrical.  Speech is fluent.  Psychiatric: Judgment intact, Mood & affect appropriate for pt's clinical situation. Dermatologic: No rashes or ulcers noted.  No cellulitis or open wounds.      Labs No results found for this or any previous visit (from the past 2160 hour(s)).  Radiology No results found.  Assessment/Plan  Lymphedema Venous study today demonstrated no evidence of deep venous thrombosis, superficial thrombophlebitis, or significant venous reflux in the  left lower extremity. The patient has been diligently wearing her 20 to 30 mmHg compression socks, elevating her legs, and trying to be active for over 3 months now.  This has resulted in minimal improvement in her left lower extremity swelling.  She does not have significant venous disease.  She has stage II lymphedema with refractory swelling given to compression and elevation.  I would recommend a lymphedema pump for adjuvant therapy at this point.  I have discussed the pathophysiology and natural history of lymphedema.  We will try to obtain the pump and see her back in about 6 months.  Pure hypercholesterolemia lipid control important in reducing the progression of atherosclerotic disease. Continue statin therapy   Essential (primary) hypertension blood pressure control important in reducing the progression of  atherosclerotic disease. On appropriate oral medications.    Leotis Pain, MD  03/12/2021 11:17 AM    This note was created with Dragon medical transcription system.  Any errors from dictation are purely unintentional

## 2021-04-20 ENCOUNTER — Telehealth (INDEPENDENT_AMBULATORY_CARE_PROVIDER_SITE_OTHER): Payer: Self-pay | Admitting: Vascular Surgery

## 2021-04-20 NOTE — Telephone Encounter (Signed)
I spoke with the patient and recommended her to contact [employer] to check on the status for the lymphedema pump ?

## 2021-04-20 NOTE — Telephone Encounter (Signed)
Patient was last seen on 03/12/21 and states two days after appointment, someone came out to measure her legs for leg cuffs and she have not heard anything since.  She would like to know what is going on with the cuffs.  Please advise. ?

## 2021-05-28 ENCOUNTER — Other Ambulatory Visit: Payer: Self-pay | Admitting: Family

## 2021-05-28 DIAGNOSIS — Z1231 Encounter for screening mammogram for malignant neoplasm of breast: Secondary | ICD-10-CM

## 2021-06-03 ENCOUNTER — Encounter: Payer: Medicaid Other | Attending: Internal Medicine | Admitting: Dietician

## 2021-06-03 NOTE — Patient Instructions (Addendum)
Limit or avoid deep fried foods; use only a small amount of cooking oil to saute in a skillet, or use cooking spray.  Choose mostly chicken, [place], or fish as meat choices. Have pork or beef only occasionally; pork tenderloin, or flank steak, sirloin are lean beef options. If you have ground beef, choose 93% lean. Increase vegetables; plan to have a generous portion with lunch and dinner meals. Try mixing vegetables  Try some Mrs Deliah Boston or Jess Barters salt free seasonings to decrease use of seasoning salt. Start with at least a few minutes of exercise most days of the week. Schedule it into a daily routine.

## 2021-06-03 NOTE — Progress Notes (Signed)
Medical Nutrition Therapy: Visit start time: 1330  end time: 1430  Assessment:  Diagnosis: HTN, hyperlipidemia, pre-diabetes, overweight Past medical history: sleep apnea, GERD  Psychosocial issues/ stress concerns: schizophrenia  Preferred learning method:  Auditory Visual   Current weight: 202.4lbs Height: 5'3" BMI: 35.85  Medications, supplements: reconciled list in medical record. Takes MVI daily  Progress and evaluation:  Patient reports she has been looking into healthy recipes for smoothies to boost energy; has a few with some fruit but low sugar, added protein powder and fiber source. Has ordered a blender.  She feels she needs to increase fiber intake including veg ie cauliflower, brussels sprouts.   Her goal is to lose to about 173lbs, ideally in 1 year, but realistically will be content with good progress.  Trying to find partner for support in making lifestyle changes.   Physical activity: has elliptical, bike, stair stepper, weights in exercise room in her home. No energy recently, so no regular exercise  Dietary Intake:  Usual eating pattern includes 3 meals and 1-2 snacks per day. Dining out frequency: 0-1 meals per week. 3-5 times per month.  Breakfast: first green tea, then 2 hours later oatmeal with blueberries and raspberries; occ yogurt and blueberries Snack: none or same as pm Lunch: likes hot dogs with chili and slaw -- plans to eliminate; deli meat with cheese, lettuce and tomato on honey wheat bread. Snack: trail mix; cheese-its, potato chips; PB/ cheese crackers; granola bar/ protein bar Supper: varies-- 5/17 2 pcs chicken, 2 pcs bread Snack: none Beverages: water 8+ cups daily; green tea; occ soda ie cheerwine or pepsi or ginger ale  Intervention:   Nutrition Care Education:   Basic nutrition: appropriate nutrient balance; appropriate meal and snack schedule; general nutrition guidelines    Weight control: importance of low sugar and low fat choices,  appropriate food portions, estimated energy needs for weight loss at 1300 kcal, provided guidance for 45% CHO, 25% pro, 30% fat; discussed incorporating physical activity Advanced nutrition: food label reading for carbohydrate Pre-Diabetes:  appropriate meal and snack schedule, appropriate carb intake and balance, healthy carb choices, role of fiber, protein, fat Hypertension: identifying high sodium foods, low sodium/ salt free seasoning options Hyperlipidemia:  healthy and unhealthy fats, lean protein sources, importance of limiting fat in dairy, butter, creamy sauces;   Nutritional Diagnosis:  [place]-2.1 Inpaired nutrition utilization and [place]-2.2 Altered nutrition-related laboratory As related to hypertension, hyperlipidemia, pre-diabetes.  As evidenced by blood pressure and blood lipids controlled with medications, and elevated HbA1C. [place]-3.3 Overweight/obesity As related to excess calories and inadequate physical activity.  As evidenced by patient with current BMI of 35.85, working on dietary changes to promote weight loss and reduce health risk.   Education Materials given:  Designer, industrial/product with food lists, sample meal pattern Foods to Choose to Lower Cholesterol (AHA) Good Fats, Bad Fats (AHA) Visit summary with goals/ instructions   Learner/ who was taught:  Patient   Level of understanding: Verbalizes/ demonstrates competency  Demonstrated degree of understanding via:   Teach back Learning barriers: None  Willingness to learn/ readiness for change: Eager, change in progress   Monitoring and Evaluation:  Dietary intake, exercise, diabetes and lipid labs, and body weight      follow up:  07/15/21 at 1:15pm

## 2021-07-08 ENCOUNTER — Ambulatory Visit
Admission: RE | Admit: 2021-07-08 | Discharge: 2021-07-08 | Disposition: A | Discharge status: Home / Self Care | Payer: Medicaid Other | Source: Ambulatory Visit | Attending: Family | Admitting: Family

## 2021-07-08 DIAGNOSIS — Z1231 Encounter for screening mammogram for malignant neoplasm of breast: Secondary | ICD-10-CM | POA: Diagnosis present

## 2021-07-15 ENCOUNTER — Ambulatory Visit: Payer: Medicaid Other | Admitting: Dietician

## 2021-07-29 ENCOUNTER — Encounter: Payer: Self-pay | Admitting: Dietician

## 2021-07-29 ENCOUNTER — Encounter: Payer: Medicaid Other | Attending: Internal Medicine | Admitting: Dietician

## 2021-07-29 VITALS — Ht 63.0 in | Wt 196.9 lb

## 2021-07-29 DIAGNOSIS — E7800 Pure hypercholesterolemia, unspecified: Secondary | ICD-10-CM | POA: Diagnosis present

## 2021-07-29 DIAGNOSIS — I10 Essential (primary) hypertension: Secondary | ICD-10-CM | POA: Diagnosis present

## 2021-07-29 DIAGNOSIS — R7303 Prediabetes: Secondary | ICD-10-CM | POA: Diagnosis present

## 2021-07-29 DIAGNOSIS — Z713 Dietary counseling and surveillance: Secondary | ICD-10-CM | POA: Insufficient documentation

## 2021-07-29 DIAGNOSIS — Z6834 Body mass index (BMI) 34.0-34.9, adult: Secondary | ICD-10-CM | POA: Insufficient documentation

## 2021-07-29 DIAGNOSIS — E669 Obesity, unspecified: Secondary | ICD-10-CM | POA: Diagnosis not present

## 2021-07-29 NOTE — Patient Instructions (Signed)
Great job making healthy food choices, keep it up! Gradually increase exercise to further help with weight loss and improving health risk.

## 2021-07-29 NOTE — Progress Notes (Signed)
Medical Nutrition Therapy: Visit start time: 1445  end time: 1515  Assessment:  Diagnosis: prediabetes, HTN, hyperlipidemia, overweight Medical history changes: no changes  Psychosocial issues/ stress concerns: schizophrenia  Medications, supplement changes: no changes per patient   Current weight: 196.9lbs Height: 5'3" BMI: 34.88  Progress and evaluation:  Limits chips, eating more fruit, peanut butter for snacks.  Has stopped eating hot dogs, no sodas since previous visit (06/03/21) Weight has decreased by about 6lbs since previous visit. Patient feels she needs to improve on frequency and duration of exercise    Dietary Intake:  Usual eating pattern includes 2-3 meals and 1-2 snacks per day. Dining out frequency: 0-1 times per week  Breakfast: green tea, then yogurt and berries; 6 crackers whole wheat with peanut butter and apple Snack: none Lunch: no hot dogs; cobb salad; sandwich with [place] and/or ham, cheese, tomato and lettuce; turkey/ salmon/ lean ground beef + veg Snack: baked chips; almonds;  Supper: sometimes combines lunch and dinner lean meat/ fish + veg Snack: none or same as pm Beverages: water, green tea; Splash flavored water  Physical activity: sporadic elliptical, stair stepper  Intervention:   Nutrition Care Education:  Basic nutrition: reviewed basic food groups; appropriate nutrient balance    Weight control: reviewed progress since previous visit; effects of exercise on weight control ie inches vs lbs; increasing duration and frequency gradually Diabetes:  reviewed appropriate meal and snack schedule; appropriate carb intake and balance; effects of stress, physical activity Other:  making gradual changes; changing 1-2 habits at a time  Other Intervention Notes: Commended patient for positive changes made. She is confident she can continue with healthier eating pattern. Her focus now will be on increasing physical activity. She does not feel she needs  any additional MNT follow up; she will schedule later if situation changes.  Nutritional Diagnosis:  [place]-2.1 Inpaired nutrition utilization and [place]-2.2 Altered nutrition-related laboratory As related to hypertension, hyperlipidemia, pre-diabetes.  As evidenced by blood pressure and blood lipids controlled with medications, and elevated HbA1C. [place]-3.3 Overweight/obesity As related to excess calories and inadequate physical activity.  As evidenced by patient with current BMI of 35.85, working on dietary changes to promote weight loss and reduce health risk.   Education Materials given:  Geographical information systems officer who was taught:  Patient   Level of understanding: Verbalizes/ demonstrates competency   Demonstrated degree of understanding via:   Teach back Learning barriers: None  Willingness to learn/ readiness for change: Eager, change in progress   Monitoring and Evaluation:  Dietary intake, exercise, BG and BP control, blood lipids, and body weight      follow up: prn

## 2021-09-09 ENCOUNTER — Ambulatory Visit (INDEPENDENT_AMBULATORY_CARE_PROVIDER_SITE_OTHER): Payer: Medicaid Other | Admitting: Nurse Practitioner

## 2021-10-25 ENCOUNTER — Ambulatory Visit (LOCAL_COMMUNITY_HEALTH_CENTER): Payer: Medicaid Other

## 2021-10-25 DIAGNOSIS — Z23 Encounter for immunization: Secondary | ICD-10-CM | POA: Diagnosis not present

## 2021-10-25 NOTE — Progress Notes (Signed)
  Are you feeling sick today? No   Have you ever received a dose of COVID-19 Vaccine? [employer], [place], [place], [place], Other) Yes  If yes, which vaccine and how many doses?   PFIZER, 5   Did you bring the vaccination record card or other documentation?  Yes   Do you have a health condition or are undergoing treatment that makes you moderately or severely immunocompromised? This would include, but not be limited to: cancer, HIV, organ transplant, immunosuppressive therapy/high-dose corticosteroids, or moderate/severe primary immunodeficiency.  No  Have you received COVID-19 vaccine before or during hematopoietic cell transplant (HCT) or CAR-T-cell therapies? No  Have you ever had an allergic reaction to: (This would include a severe allergic reaction or a reaction that caused hives, swelling, or respiratory distress, including wheezing.) A component of a COVID-19 vaccine or a previous dose of COVID-19 vaccine? No   Have you ever had an allergic reaction to another vaccine (other thanCOVID-19 vaccine) or an injectable medication? (This would include a severe allergic reaction or a reaction that caused hives, swelling, or respiratory distress, including wheezing.)   No    Do you have a history of any of the following:  Myocarditis or Pericarditis No  Dermal fillers:  No  Multisystem Inflammatory Syndrome (MIS-C or MIS-A)? No  COVID-19 disease within the past 3 months? No  Vaccinated with monkeypox vaccine in the last 4 weeks? No  Pt was in clinic requested flu and Pfizer Covid vaccine. Eligible and administered, tolerated well. Verbalized understanding of VIS, [facility] copy and covid vaccination record card. M.Larcenia Holaday, LPN.

## 2022-02-21 ENCOUNTER — Other Ambulatory Visit: Payer: Self-pay | Admitting: Internal Medicine

## 2022-05-17 ENCOUNTER — Ambulatory Visit: Payer: Medicaid Other | Admitting: Family

## 2022-05-24 ENCOUNTER — Telehealth: Payer: Self-pay | Admitting: Internal Medicine

## 2022-05-24 NOTE — Telephone Encounter (Signed)
Entered in error

## 2022-05-26 ENCOUNTER — Ambulatory Visit: Payer: Self-pay | Admitting: Family

## 2022-05-26 ENCOUNTER — Ambulatory Visit: Payer: Medicaid Other | Admitting: Family

## 2022-05-26 ENCOUNTER — Encounter: Payer: Self-pay | Admitting: Family

## 2022-05-26 ENCOUNTER — Ambulatory Visit (INDEPENDENT_AMBULATORY_CARE_PROVIDER_SITE_OTHER): Payer: Medicaid Other

## 2022-05-26 VITALS — BP 127/78 | HR 86 | Ht 63.0 in | Wt 191.8 lb

## 2022-05-26 DIAGNOSIS — F209 Schizophrenia, unspecified: Secondary | ICD-10-CM

## 2022-05-26 DIAGNOSIS — K219 Gastro-esophageal reflux disease without esophagitis: Secondary | ICD-10-CM

## 2022-05-26 DIAGNOSIS — E7800 Pure hypercholesterolemia, unspecified: Secondary | ICD-10-CM | POA: Diagnosis not present

## 2022-05-26 DIAGNOSIS — M25511 Pain in right shoulder: Secondary | ICD-10-CM | POA: Diagnosis not present

## 2022-05-26 DIAGNOSIS — E538 Deficiency of other specified B group vitamins: Secondary | ICD-10-CM

## 2022-05-26 DIAGNOSIS — I890 Lymphedema, not elsewhere classified: Secondary | ICD-10-CM | POA: Diagnosis not present

## 2022-05-26 DIAGNOSIS — R7303 Prediabetes: Secondary | ICD-10-CM

## 2022-05-26 DIAGNOSIS — R5383 Other fatigue: Secondary | ICD-10-CM

## 2022-05-26 DIAGNOSIS — E559 Vitamin D deficiency, unspecified: Secondary | ICD-10-CM

## 2022-05-26 DIAGNOSIS — I10 Essential (primary) hypertension: Secondary | ICD-10-CM | POA: Diagnosis not present

## 2022-05-26 MED ORDER — LISINOPRIL 10 MG PO TABS: 10.0000 mg | ORAL_TABLET | Freq: Every day | ORAL | 11 refills | Status: DC

## 2022-05-26 NOTE — Progress Notes (Signed)
Lm informing pt

## 2022-05-26 NOTE — Progress Notes (Signed)
Established Patient Office Visit  Subjective:  Patient ID: Kerri Clark, female    DOB: 1965-11-22  Age: 57 y.o. MRN: [id number]  Chief Complaint  Patient presents with   Follow-up    6 month follow up    Patient is here for her 6 month follow up.   Stopped her cholesterol meds on April 18th, says she has been having issues with her right shoulder.  She originally thought that it could have been from weight lifting.  She also asks if she can come off of the amlodipine, says she thinks it's causing some of the swelling in her legs.   She does have lymphedema, and she has been elevating legs, but she still could benefit from compression stockings.   Due for labs.   No other concerns at this time.   Past Medical History:  Diagnosis Date   Hypercholesterolemia    Hypertension    Schizophrenia (HCC)    Sleep apnea     Past Surgical History:  Procedure Laterality Date   COLONOSCOPY WITH PROPOFOL N/A 11/17/2014   Procedure: COLONOSCOPY WITH PROPOFOL;  Surgeon: Christena Deem, MD;  Location: [facility] ENDOSCOPY;  Service: Endoscopy;  Laterality: N/A;   UMBILICAL HERNIA REPAIR      Social History   Socioeconomic History   Marital status: Single    Spouse name: Not on file   Number of children: Not on file   Years of education: Not on file   Highest education level: Not on file  Occupational History   Not on file  Tobacco Use   Smoking status: Former    Types: Cigarettes    Quit date: 01/17/2003    Years since quitting: 19.3   Smokeless tobacco: Not on file  Substance and Sexual Activity   Alcohol use: No    Alcohol/week: 0.0 standard drinks of alcohol   Drug use: No   Sexual activity: Not on file  Other Topics Concern   Not on file  Social History Narrative   Not on file   Social Determinants of Health   Financial Resource Strain: Not on file  Food Insecurity: Not on file  Transportation Needs: Not on file  Physical Activity: Not on file  Stress: Not  on file  Social Connections: Not on file  Intimate Partner Violence: Not on file    Family History  Problem Relation Age of Onset   Breast cancer Neg Hx     Allergies  Allergen Reactions   Atorvastatin Other (See Comments)   Rosuvastatin     Note: muscle cramps    Review of Systems  Cardiovascular:  Positive for leg swelling.  Musculoskeletal:  Positive for joint pain.  All other systems reviewed and are negative.      Objective:   BP 127/78   Pulse 86   Ht 5\' 3"  (1.6 m)   Wt 191 lb 12.8 oz (87 kg)   LMP 11/16/2014   SpO2 97%   BMI 33.98 kg/m   Vitals:   05/26/22 0941  BP: 127/78  Pulse: 86  Height: 5\' 3"  (1.6 m)  Weight: 191 lb 12.8 oz (87 kg)  SpO2: 97%  BMI (Calculated): 33.98    Physical Exam Vitals and nursing note reviewed.  Constitutional:      Appearance: Normal appearance. She is normal weight.  HENT:     Head: Normocephalic.  Eyes:     Pupils: Pupils are equal, round, and reactive to light.  Cardiovascular:  Rate and Rhythm: Normal rate.     Pulses: Normal pulses.  Pulmonary:     Effort: Pulmonary effort is normal.  Musculoskeletal:     Right shoulder: Tenderness present. Decreased range of motion.     Right lower leg: 1+ Pitting Edema present.     Left lower leg: 1+ Pitting Edema present.  Neurological:     General: No focal deficit present.     Mental Status: She is alert and oriented to person, place, and time.      No results found for any visits on 05/26/22.  No results found for this or any previous visit (from the past 2160 hour(s)).     Assessment & Plan:   Problem List Items Addressed This Visit       Active Problems   GERD (gastroesophageal reflux disease)   Relevant Orders   CBC With Differential   CMP14+EGFR   Pure hypercholesterolemia   Relevant Medications   lisinopril (ZESTRIL) 10 MG tablet   Other Relevant Orders   Lipid panel   CBC With Differential   CMP14+EGFR   Lymphedema   Relevant Orders    CBC With Differential   CMP14+EGFR   Other Visit Diagnoses     Essential hypertension, benign    -  Primary   Relevant Medications   lisinopril (ZESTRIL) 10 MG tablet   Other Relevant Orders   CBC With Differential   CMP14+EGFR   B12 deficiency due to diet       Relevant Orders   CBC With Differential   CMP14+EGFR   Vitamin B12   Prediabetes       Relevant Orders   CBC With Differential   CMP14+EGFR   Hemoglobin A1c   Vitamin D deficiency, unspecified       Relevant Orders   VITAMIN D 25 Hydroxy (Vit-D Deficiency, Fractures)   CBC With Differential   CMP14+EGFR   Other fatigue       Relevant Orders   CBC With Differential   CMP14+EGFR   TSH   Acute pain of right shoulder       Relevant Orders   DG Shoulder Right   CBC With Differential   CMP14+EGFR       Return in about 3 months (around 08/26/2022) for F/U.   Total time spent: 30 minutes  Miki Kins, FNP  05/26/2022

## 2022-05-27 LAB — CBC WITH DIFFERENTIAL
Basophils Absolute: 0 10*3/uL (ref 0.0–0.2)
Basos: 1 %
EOS (ABSOLUTE): 0.2 10*3/uL (ref 0.0–0.4)
Eos: 4 %
Hematocrit: 39.3 % (ref 34.0–46.6)
Hemoglobin: 12.7 g/dL (ref 11.1–15.9)
Immature Grans (Abs): 0 10*3/uL (ref 0.0–0.1)
Immature Granulocytes: 0 %
Lymphocytes Absolute: 1.6 10*3/uL (ref 0.7–3.1)
Lymphs: 37 %
MCH: 26.2 pg — ABNORMAL LOW (ref 26.6–33.0)
MCHC: 32.3 g/dL (ref 31.5–35.7)
MCV: 81 fL (ref 79–97)
Monocytes Absolute: 0.4 10*3/uL (ref 0.1–0.9)
Monocytes: 9 %
Neutrophils Absolute: 2.1 10*3/uL (ref 1.4–7.0)
Neutrophils: 49 %
RBC: 4.84 x10E6/uL (ref 3.77–5.28)
RDW: 14.2 % (ref 11.7–15.4)
WBC: 4.3 10*3/uL (ref 3.4–10.8)

## 2022-05-27 LAB — CMP14+EGFR
ALT: 17 IU/L (ref 0–32)
AST: 20 IU/L (ref 0–40)
Albumin/Globulin Ratio: 1.5 (ref 1.2–2.2)
Albumin: 4.1 g/dL (ref 3.8–4.9)
Alkaline Phosphatase: 104 IU/L (ref 44–121)
BUN/Creatinine Ratio: 14 (ref 9–23)
BUN: 14 mg/dL (ref 6–24)
Bilirubin Total: 0.3 mg/dL (ref 0.0–1.2)
CO2: 22 mmol/L (ref 20–29)
Calcium: 9.9 mg/dL (ref 8.7–10.2)
Chloride: 100 mmol/L (ref 96–106)
Creatinine, Ser: 1.02 mg/dL — ABNORMAL HIGH (ref 0.57–1.00)
Globulin, Total: 2.8 g/dL (ref 1.5–4.5)
Glucose: 88 mg/dL (ref 70–99)
Potassium: 4.4 mmol/L (ref 3.5–5.2)
Sodium: 135 mmol/L (ref 134–144)
Total Protein: 6.9 g/dL (ref 6.0–8.5)
eGFR: 64 mL/min/{1.73_m2} (ref >59)

## 2022-05-27 LAB — LIPID PANEL
Chol/HDL Ratio: 4.4 ratio (ref 0.0–4.4)
Cholesterol, Total: 210 mg/dL — ABNORMAL HIGH (ref 100–199)
HDL: 48 mg/dL (ref >39)
LDL Chol Calc (NIH): 148 mg/dL — ABNORMAL HIGH (ref 0–99)
Triglycerides: 77 mg/dL (ref 0–149)
VLDL Cholesterol Cal: 14 mg/dL (ref 5–40)

## 2022-05-27 LAB — TSH: TSH: 1.37 u[IU]/mL (ref 0.450–4.500)

## 2022-05-27 LAB — VITAMIN B12: Vitamin B-12: 875 pg/mL (ref 232–1245)

## 2022-05-27 LAB — HEMOGLOBIN A1C
Est. average glucose Bld gHb Est-mCnc: 126 mg/dL
Hgb A1c MFr Bld: 6 % — ABNORMAL HIGH (ref 4.8–5.6)

## 2022-05-27 LAB — VITAMIN D 25 HYDROXY (VIT D DEFICIENCY, FRACTURES): Vit D, 25-Hydroxy: 46.2 ng/mL (ref 30.0–100.0)

## 2022-06-01 ENCOUNTER — Other Ambulatory Visit: Payer: Self-pay | Admitting: Family

## 2022-06-01 ENCOUNTER — Telehealth: Payer: Self-pay | Admitting: Family

## 2022-06-01 DIAGNOSIS — Z1231 Encounter for screening mammogram for malignant neoplasm of breast: Secondary | ICD-10-CM

## 2022-06-01 NOTE — Telephone Encounter (Signed)
Patient called in inquiring about her lab results from last week. Please advise.

## 2022-06-03 ENCOUNTER — Telehealth: Payer: Self-pay

## 2022-06-03 MED ORDER — EZETIMIBE 10 MG PO TABS: 10.0000 mg | ORAL_TABLET | Freq: Every day | ORAL | 1 refills | Status: DC

## 2022-06-03 NOTE — Addendum Note (Signed)
Addended by: Grayling Congress on: 06/03/2022 02:47 PM   Modules accepted: Orders

## 2022-06-03 NOTE — Telephone Encounter (Signed)
Patient called back and doesn't want to change medication. She looked up the side effects of suggested medication and they are worse than what she was taking. She will resume her old medication because she has a supply of it.

## 2022-06-27 ENCOUNTER — Other Ambulatory Visit: Payer: Self-pay | Admitting: Internal Medicine

## 2022-07-11 ENCOUNTER — Ambulatory Visit
Admission: RE | Admit: 2022-07-11 | Discharge: 2022-07-11 | Disposition: A | Discharge status: Home / Self Care | Payer: Medicaid Other | Source: Ambulatory Visit | Attending: Family | Admitting: Family

## 2022-07-11 DIAGNOSIS — Z1231 Encounter for screening mammogram for malignant neoplasm of breast: Secondary | ICD-10-CM | POA: Diagnosis present

## 2022-08-26 ENCOUNTER — Ambulatory Visit: Payer: MEDICAID | Admitting: Family

## 2022-08-26 VITALS — BP 148/92 | HR 83 | Ht 63.0 in | Wt 185.0 lb

## 2022-08-26 DIAGNOSIS — F209 Schizophrenia, unspecified: Secondary | ICD-10-CM

## 2022-08-26 DIAGNOSIS — R5383 Other fatigue: Secondary | ICD-10-CM

## 2022-08-26 DIAGNOSIS — I10 Essential (primary) hypertension: Secondary | ICD-10-CM

## 2022-08-26 DIAGNOSIS — E7800 Pure hypercholesterolemia, unspecified: Secondary | ICD-10-CM | POA: Diagnosis not present

## 2022-08-26 DIAGNOSIS — R7303 Prediabetes: Secondary | ICD-10-CM

## 2022-08-26 DIAGNOSIS — E538 Deficiency of other specified B group vitamins: Secondary | ICD-10-CM | POA: Diagnosis not present

## 2022-08-26 DIAGNOSIS — I890 Lymphedema, not elsewhere classified: Secondary | ICD-10-CM

## 2022-08-26 DIAGNOSIS — E559 Vitamin D deficiency, unspecified: Secondary | ICD-10-CM

## 2022-08-26 DIAGNOSIS — I2089 Other forms of angina pectoris: Secondary | ICD-10-CM

## 2022-08-26 NOTE — Progress Notes (Unsigned)
Established Patient Office Visit  Subjective:  Patient ID: Kerri Clark, female    DOB: 01/14/1966  Age: 57 y.o. MRN: [id number]  Chief Complaint  Patient presents with   Follow-up    3 Months Follow up    Patient is here today for her 6 months follow up.  She has been feeling fairly well since last appointment.   She does not have additional concerns to discuss today.  Labs are due today. She needs refills.   I have reviewed her active problem list, medication list, allergies, notes from last encounter, lab results for her appointment today.    No other concerns at this time.   Past Medical History:  Diagnosis Date   Hypercholesterolemia    Hypertension    Schizophrenia (HCC)    Sleep apnea     Past Surgical History:  Procedure Laterality Date   COLONOSCOPY WITH PROPOFOL N/A 11/17/2014   Procedure: COLONOSCOPY WITH PROPOFOL;  Surgeon: Christena Deem, MD;  Location: [facility] ENDOSCOPY;  Service: Endoscopy;  Laterality: N/A;   UMBILICAL HERNIA REPAIR      Social History   Socioeconomic History   Marital status: Single    Spouse name: Not on file   Number of children: Not on file   Years of education: Not on file   Highest education level: Not on file  Occupational History   Not on file  Tobacco Use   Smoking status: Former    Current packs/day: 0.00    Types: Cigarettes    Quit date: 01/17/2003    Years since quitting: 19.6   Smokeless tobacco: Not on file  Substance and Sexual Activity   Alcohol use: No    Alcohol/week: 0.0 standard drinks of alcohol   Drug use: No   Sexual activity: Not on file  Other Topics Concern   Not on file  Social History Narrative   Not on file   Social Determinants of Health   Financial Resource Strain: Not on file  Food Insecurity: Not on file  Transportation Needs: Not on file  Physical Activity: Not on file  Stress: Not on file  Social Connections: Not on file  Intimate Partner Violence: Not on file     Family History  Problem Relation Age of Onset   Breast cancer Neg Hx     Allergies  Allergen Reactions   Atorvastatin Other (See Comments)   Rosuvastatin     Note: muscle cramps    Review of Systems  All other systems reviewed and are negative.      Objective:   BP (!) 148/92   Pulse 83   Ht 5\' 3"  (1.6 m)   Wt 185 lb (83.9 kg)   LMP 11/16/2014   SpO2 98%   BMI 32.77 kg/m   Vitals:   08/26/22 0935  BP: (!) 148/92  Pulse: 83  Height: 5\' 3"  (1.6 m)  Weight: 185 lb (83.9 kg)  SpO2: 98%  BMI (Calculated): 32.78    Physical Exam Vitals and nursing note reviewed.  Constitutional:      Appearance: Normal appearance. She is normal weight.  HENT:     Head: Normocephalic.  Eyes:     Extraocular Movements: Extraocular movements intact.     Conjunctiva/sclera: Conjunctivae normal.     Pupils: Pupils are equal, round, and reactive to light.  Cardiovascular:     Rate and Rhythm: Normal rate.  Pulmonary:     Effort: Pulmonary effort is normal.  Neurological:  General: No focal deficit present.     Mental Status: She is alert and oriented to person, place, and time. Mental status is at baseline.  Psychiatric:        Attention and Perception: Attention and perception normal.        Mood and Affect: Mood normal. Affect is blunt (likely 2/2 her medications).        Behavior: Behavior normal.        Thought Content: Thought content normal.        Cognition and Memory: Cognition and memory normal.        Judgment: Judgment normal.      Results for orders placed or performed in visit on 08/26/22  Lipid panel  Result Value Ref Range   Cholesterol, Total 134 100 - 199 mg/dL   Triglycerides 54 0 - 149 mg/dL   HDL 46 >29 mg/dL   VLDL Cholesterol Cal 12 5 - 40 mg/dL   LDL Chol Calc (NIH) 76 0 - 99 mg/dL   Chol/HDL Ratio 2.9 0.0 - 4.4 ratio  VITAMIN D 25 Hydroxy (Vit-D Deficiency, Fractures)  Result Value Ref Range   Vit D, 25-Hydroxy 54.6 30.0 - 100.0 ng/mL   CMP14+EGFR  Result Value Ref Range   Glucose 88 70 - 99 mg/dL   BUN 18 6 - 24 mg/dL   Creatinine, Ser 5.62 (H) 0.57 - 1.00 mg/dL   eGFR 58 (L) >13 YQ/MVH/8.46   BUN/Creatinine Ratio 16 9 - 23   Sodium 134 134 - 144 mmol/L   Potassium 4.4 3.5 - 5.2 mmol/L   Chloride 100 96 - 106 mmol/L   CO2 22 20 - 29 mmol/L   Calcium 9.3 8.7 - 10.2 mg/dL   Total Protein 7.0 6.0 - 8.5 g/dL   Albumin 4.3 3.8 - 4.9 g/dL   Globulin, Total 2.7 1.5 - 4.5 g/dL   Bilirubin Total 0.3 0.0 - 1.2 mg/dL   Alkaline Phosphatase 80 44 - 121 IU/L   AST 21 0 - 40 IU/L   ALT 17 0 - 32 IU/L  TSH  Result Value Ref Range   TSH 2.040 0.450 - 4.500 uIU/mL  Hemoglobin A1c  Result Value Ref Range   Hgb A1c MFr Bld 6.0 (H) 4.8 - 5.6 %   Est. average glucose Bld gHb Est-mCnc 126 mg/dL  Vitamin N62  Result Value Ref Range   Vitamin B-12 816 232 - 1,245 pg/mL    Recent Results (from the past 2160 hour(s))  Lipid panel     Status: None   Collection Time: 08/26/22 10:04 AM  Result Value Ref Range   Cholesterol, Total 134 100 - 199 mg/dL   Triglycerides 54 0 - 149 mg/dL   HDL 46 >95 mg/dL   VLDL Cholesterol Cal 12 5 - 40 mg/dL   LDL Chol Calc (NIH) 76 0 - 99 mg/dL   Chol/HDL Ratio 2.9 0.0 - 4.4 ratio    Comment:                                   T. Chol/HDL Ratio                                             Men  Women  1/2 Avg.Risk  3.4    3.3                                   Avg.Risk  5.0    4.4                                2X Avg.Risk  9.6    7.1                                3X Avg.Risk 23.4   11.0   VITAMIN D 25 Hydroxy (Vit-D Deficiency, Fractures)     Status: None   Collection Time: 08/26/22 10:04 AM  Result Value Ref Range   Vit D, 25-Hydroxy 54.6 30.0 - 100.0 ng/mL    Comment: Vitamin D deficiency has been defined by the Institute of Medicine and an Endocrine Society practice guideline as a level of serum 25-OH vitamin D less than 20 ng/mL (1,2). The Endocrine Society  went on to further define vitamin D insufficiency as a level between 21 and 29 ng/mL (2). 1. IOM (Institute of Medicine). 2010. Dietary reference    intakes for calcium and D. [place]: The    [employer]. 2. Holick MF, Binkley , Bischoff-Ferrari HA, et al.    Evaluation, treatment, and prevention of vitamin D    deficiency: an Endocrine Society clinical practice    guideline. JCEM. 2011 Jul; 96(7):1911-30.   CMP14+EGFR     Status: Abnormal   Collection Time: 08/26/22 10:04 AM  Result Value Ref Range   Glucose 88 70 - 99 mg/dL   BUN 18 6 - 24 mg/dL   Creatinine, Ser 2.95 (H) 0.57 - 1.00 mg/dL   eGFR 58 (L) >62 ZH/YQM/5.78   BUN/Creatinine Ratio 16 9 - 23   Sodium 134 134 - 144 mmol/L   Potassium 4.4 3.5 - 5.2 mmol/L   Chloride 100 96 - 106 mmol/L   CO2 22 20 - 29 mmol/L   Calcium 9.3 8.7 - 10.2 mg/dL   Total Protein 7.0 6.0 - 8.5 g/dL   Albumin 4.3 3.8 - 4.9 g/dL   Globulin, Total 2.7 1.5 - 4.5 g/dL   Bilirubin Total 0.3 0.0 - 1.2 mg/dL   Alkaline Phosphatase 80 44 - 121 IU/L   AST 21 0 - 40 IU/L   ALT 17 0 - 32 IU/L  TSH     Status: None   Collection Time: 08/26/22 10:04 AM  Result Value Ref Range   TSH 2.040 0.450 - 4.500 uIU/mL  Hemoglobin A1c     Status: Abnormal   Collection Time: 08/26/22 10:04 AM  Result Value Ref Range   Hgb A1c MFr Bld 6.0 (H) 4.8 - 5.6 %    Comment:          Prediabetes: 5.7 - 6.4          Diabetes: >6.4          Glycemic control for adults with diabetes: <7.0    Est. average glucose Bld gHb Est-mCnc 126 mg/dL  Vitamin I69     Status: None   Collection Time: 08/26/22 10:04 AM  Result Value Ref Range   Vitamin B-12 816 232 - 1,245 pg/mL       Assessment & Plan:   Problem List Items  Addressed This Visit       Active Problems   Essential (primary) hypertension    Blood pressure has been well controlled with current medications. Patient does not wish to change meds at this time.  Continue current therapy.  Will  reassess at follow up.        Relevant Orders   CMP14+EGFR (Completed)   CBC with Differential/Platelet   Schizophrenia [facility])    Patient is seen by Psychiatry, who manage this condition.  She is well controlled with current therapy.   Will defer to them for further changes to plan of care.       Pure hypercholesterolemia - Primary    Checking labs today.  Continue current therapy for lipid control. Will modify as needed based on labwork results.        Relevant Orders   Lipid panel (Completed)   CMP14+EGFR (Completed)   CBC with Differential/Platelet   Stable angina pectoris    Patient is seen by Cardiology, who manage this condition.  She is well controlled with current therapy.   Will defer to them for further changes to plan of care.       Lymphedema    Much improved today.  Patient is stable and managed well.   Continue current therapy.       Relevant Orders   CMP14+EGFR (Completed)   CBC with Differential/Platelet   Other Visit Diagnoses     B12 deficiency due to diet       Checking labs today.  Will continue supplements as needed.   Relevant Orders   CMP14+EGFR (Completed)   Vitamin B12 (Completed)   CBC with Differential/Platelet   Prediabetes       Checking labs today Patient counseled on dietary choices and verbalized understanding.   Relevant Orders   CMP14+EGFR (Completed)   Hemoglobin A1c (Completed)   CBC with Differential/Platelet   Vitamin D deficiency, unspecified       Checking labs today.  Will continue supplements as needed.   Relevant Orders   VITAMIN D 25 Hydroxy (Vit-D Deficiency, Fractures) (Completed)   CMP14+EGFR (Completed)   CBC with Differential/Platelet   Other fatigue       Relevant Orders   CMP14+EGFR (Completed)   TSH (Completed)   CBC with Differential/Platelet       Return in about 6 months (around 02/26/2023).   Total time spent: 30 minutes  Miki Kins, FNP  08/26/2022   This document may have  been prepared by [facility] Voice Recognition software and as such may include unintentional dictation errors.

## 2022-08-27 ENCOUNTER — Encounter: Payer: Self-pay | Admitting: Family

## 2022-08-27 NOTE — Assessment & Plan Note (Signed)
Blood pressure has been well controlled with current medications. Patient does not wish to change meds at this time.  Continue current therapy.  Will reassess at follow up.

## 2022-08-27 NOTE — Assessment & Plan Note (Signed)
 Patient is seen by Psychiatry, who manage this condition.  She is well controlled with current therapy.   Will defer to them for further changes to plan of care.

## 2022-08-27 NOTE — Assessment & Plan Note (Signed)
Checking labs today.  Continue current therapy for lipid control. Will modify as needed based on labwork results.  

## 2022-08-27 NOTE — Assessment & Plan Note (Signed)
Patient is seen by Cardiology, who manage this condition.  She is well controlled with current therapy.   Will defer to them for further changes to plan of care.

## 2022-08-27 NOTE — Assessment & Plan Note (Signed)
Much improved today.  Patient is stable and managed well.   Continue current therapy.

## 2022-08-30 ENCOUNTER — Telehealth: Payer: Self-pay

## 2022-08-31 NOTE — Telephone Encounter (Signed)
LMTRC

## 2022-09-28 ENCOUNTER — Other Ambulatory Visit: Payer: Self-pay | Admitting: Family

## 2022-09-30 ENCOUNTER — Telehealth: Payer: Self-pay

## 2022-10-03 ENCOUNTER — Ambulatory Visit: Payer: MEDICAID

## 2022-10-03 DIAGNOSIS — Z23 Encounter for immunization: Secondary | ICD-10-CM | POA: Diagnosis not present

## 2022-10-03 DIAGNOSIS — Z719 Counseling, unspecified: Secondary | ICD-10-CM

## 2022-10-03 NOTE — Progress Notes (Signed)
In nurse clinic for immunizations, requesting Covid and Flu. Voices no concerns. VIS reviewed and given to patient. Vaccines tolerated well. Patient monitored for 15 mins; no problems. [facility] updated and copies given to patient.   Abagail Kitchens, RN

## 2022-10-04 NOTE — Telephone Encounter (Signed)
Patient notified by rebecca malone.

## 2023-01-02 ENCOUNTER — Other Ambulatory Visit: Payer: Self-pay | Admitting: Family

## 2023-03-02 ENCOUNTER — Ambulatory Visit: Payer: MEDICAID | Admitting: Family

## 2023-03-02 ENCOUNTER — Encounter: Payer: Self-pay | Admitting: Family

## 2023-03-02 VITALS — BP 158/102 | HR 80 | Ht 63.0 in | Wt 190.8 lb

## 2023-03-02 DIAGNOSIS — E538 Deficiency of other specified B group vitamins: Secondary | ICD-10-CM | POA: Diagnosis not present

## 2023-03-02 DIAGNOSIS — E782 Mixed hyperlipidemia: Secondary | ICD-10-CM

## 2023-03-02 DIAGNOSIS — R7303 Prediabetes: Secondary | ICD-10-CM

## 2023-03-02 DIAGNOSIS — I10 Essential (primary) hypertension: Secondary | ICD-10-CM

## 2023-03-02 DIAGNOSIS — R5383 Other fatigue: Secondary | ICD-10-CM

## 2023-03-02 DIAGNOSIS — F209 Schizophrenia, unspecified: Secondary | ICD-10-CM

## 2023-03-02 DIAGNOSIS — E7800 Pure hypercholesterolemia, unspecified: Secondary | ICD-10-CM

## 2023-03-02 DIAGNOSIS — E559 Vitamin D deficiency, unspecified: Secondary | ICD-10-CM

## 2023-03-02 DIAGNOSIS — Z1211 Encounter for screening for malignant neoplasm of colon: Secondary | ICD-10-CM

## 2023-03-02 DIAGNOSIS — Z114 Encounter for screening for human immunodeficiency virus [HIV]: Secondary | ICD-10-CM

## 2023-03-02 DIAGNOSIS — Z1159 Encounter for screening for other viral diseases: Secondary | ICD-10-CM

## 2023-03-02 MED ORDER — LISINOPRIL 20 MG PO TABS: 20.0000 mg | ORAL_TABLET | Freq: Every day | ORAL | 11 refills | Status: DC

## 2023-03-02 MED ORDER — NEXLIZET 180-10 MG PO TABS: 1.0000 | ORAL_TABLET | Freq: Every day | ORAL | 3 refills | Status: AC

## 2023-03-02 NOTE — Assessment & Plan Note (Signed)
Patient is seen by Psychiatry, who manage this condition.  She is well controlled with current therapy.   Will defer to them for further changes to plan of care.

## 2023-03-02 NOTE — Addendum Note (Signed)
Addended by: Grayling Congress on: 03/02/2023 08:06 PM   Modules accepted: Orders

## 2023-03-02 NOTE — Assessment & Plan Note (Signed)
Checking labs today.  Continue current therapy for lipid control. Will modify as needed based on labwork results.

## 2023-03-02 NOTE — Assessment & Plan Note (Addendum)
Blood pressure high, so will increase dose of lisinopril. Asked pt to stop by in the next week and get her BP rechecked by the nurses to evaluate effectiveness.  Continue current therapy.  Will reassess at follow up.

## 2023-03-02 NOTE — Progress Notes (Signed)
Established Patient Office Visit  Subjective:  Patient ID: Kerri Clark, female    DOB: 1965-01-20  Age: 58 y.o. MRN: [id number]  Chief Complaint  Patient presents with   Follow-up    6 month follow up    Patient is here today for her 6 months follow up.  She has been feeling fairly well since last appointment.   She does have additional concerns to discuss today.  Blood pressure has been elevated, patient has not been taking the amlodipine.  Due for colonoscopy. Labs are due today. She needs refills.   I have reviewed her active problem list, medication list, allergies, health maintenance, notes from last encounter, lab results for her appointment today.      No other concerns at this time.   Past Medical History:  Diagnosis Date   Hypercholesterolemia    Hypertension    Schizophrenia (HCC)    Sleep apnea     Past Surgical History:  Procedure Laterality Date   COLONOSCOPY WITH PROPOFOL N/A 11/17/2014   Procedure: COLONOSCOPY WITH PROPOFOL;  Surgeon: Christena Deem, MD;  Location: [facility] ENDOSCOPY;  Service: Endoscopy;  Laterality: N/A;   UMBILICAL HERNIA REPAIR      Social History   Socioeconomic History   Marital status: Single    Spouse name: Not on file   Number of children: Not on file   Years of education: Not on file   Highest education level: Not on file  Occupational History   Not on file  Tobacco Use   Smoking status: Former    Current packs/day: 0.00    Types: Cigarettes    Quit date: 01/17/2003    Years since quitting: 20.1   Smokeless tobacco: Not on file  Substance and Sexual Activity   Alcohol use: No    Alcohol/week: 0.0 standard drinks of alcohol   Drug use: No   Sexual activity: Not on file  Other Topics Concern   Not on file  Social History Narrative   Not on file   Social Drivers of Health   Financial Resource Strain: Not on file  Food Insecurity: Not on file  Transportation Needs: Not on file  Physical Activity:  Not on file  Stress: Not on file  Social Connections: Not on file  Intimate Partner Violence: Not on file    Family History  Problem Relation Age of Onset   Breast cancer Neg Hx     Allergies  Allergen Reactions   Atorvastatin Other (See Comments)   Rosuvastatin     Note: muscle cramps    Review of Systems  All other systems reviewed and are negative.      Objective:   BP (!) 158/102   Pulse 80   Ht 5\' 3"  (1.6 m)   Wt 190 lb 12.8 oz (86.5 kg)   LMP 11/16/2014   SpO2 99%   BMI 33.80 kg/m   Vitals:   03/02/23 0934  BP: (!) 158/102  Pulse: 80  Height: 5\' 3"  (1.6 m)  Weight: 190 lb 12.8 oz (86.5 kg)  SpO2: 99%  BMI (Calculated): 33.81    Physical Exam Vitals and nursing note reviewed.  Constitutional:      Appearance: Normal appearance. She is normal weight.  HENT:     Head: Normocephalic.  Eyes:     Extraocular Movements: Extraocular movements intact.     Conjunctiva/sclera: Conjunctivae normal.     Pupils: Pupils are equal, round, and reactive to light.  Cardiovascular:  Rate and Rhythm: Normal rate.  Pulmonary:     Effort: Pulmonary effort is normal.  Neurological:     General: No focal deficit present.     Mental Status: She is alert and oriented to person, place, and time. Mental status is at baseline.  Psychiatric:        Mood and Affect: Mood normal.        Behavior: Behavior normal.        Thought Content: Thought content normal.        Judgment: Judgment normal.      No results found for any visits on 03/02/23.  No results found for this or any previous visit (from the past 2160 hours).     Assessment & Plan:   Problem List Items Addressed This Visit       Cardiovascular and Mediastinum   Essential (primary) hypertension   Blood pressure high, so will increase dose of lisinopril. Asked pt to stop by in the next week and get her BP rechecked by the nurses to evaluate effectiveness.  Continue current therapy.  Will reassess at  follow up.        Relevant Medications   lisinopril (ZESTRIL) 20 MG tablet   Bempedoic Acid-Ezetimibe (NEXLIZET) 180-10 MG TABS   Other Relevant Orders   CMP14+EGFR   CBC with Diff     Other   Schizophrenia (HCC)   Patient is seen by Psychiatry, who manage this condition.  She is well controlled with current therapy.   Will defer to them for further changes to plan of care.       Relevant Orders   CMP14+EGFR   CBC with Diff   Pure hypercholesterolemia - Primary   Checking labs today.  Continue current therapy for lipid control. Will modify as needed based on labwork results.        Relevant Medications   lisinopril (ZESTRIL) 20 MG tablet   Bempedoic Acid-Ezetimibe (NEXLIZET) 180-10 MG TABS   Other Relevant Orders   Lipid panel   CMP14+EGFR   CBC with Diff   Other Visit Diagnoses       B12 deficiency due to diet       Checking labs today.  Will continue supplements as needed.   Relevant Orders   CMP14+EGFR   Vitamin B12   CBC with Diff     Prediabetes       A1C is in prediabetic ranges. Patient counseled on dietary choices and verbalized understanding. Will reassess at follow up after next lab check.   Relevant Orders   CMP14+EGFR   Hemoglobin A1c   CBC with Diff     Vitamin D deficiency, unspecified       Checking labs today.  Will continue supplements as needed.   Relevant Orders   VITAMIN D 25 Hydroxy (Vit-D Deficiency, Fractures)   CMP14+EGFR   CBC with Diff     Other fatigue       Relevant Orders   TSH     Screening for HIV without presence of risk factors       Test ordered in office today. Will call with results.   Relevant Orders   HIV antibody (with reflex)     Need for hepatitis C screening test       Test ordered in office today. Will call with results.   Relevant Orders   Hepatitis C Ab reflex to Quant PCR       Return in about 6 months (around 08/30/2023).  Total time spent: 20 minutes  Miki Kins,  FNP  03/02/2023   This document may have been prepared by [facility] Voice Recognition software and as such may include unintentional dictation errors.

## 2023-03-03 LAB — VITAMIN D 25 HYDROXY (VIT D DEFICIENCY, FRACTURES): Vit D, 25-Hydroxy: 57.1 ng/mL (ref 30.0–100.0)

## 2023-03-03 LAB — CMP14+EGFR
ALT: 14 [IU]/L (ref 0–32)
AST: 19 [IU]/L (ref 0–40)
Albumin: 4.3 g/dL (ref 3.8–4.9)
Alkaline Phosphatase: 76 [IU]/L (ref 44–121)
BUN/Creatinine Ratio: 12 (ref 9–23)
BUN: 13 mg/dL (ref 6–24)
Bilirubin Total: 0.4 mg/dL (ref 0.0–1.2)
CO2: 22 mmol/L (ref 20–29)
Calcium: 9.8 mg/dL (ref 8.7–10.2)
Chloride: 101 mmol/L (ref 96–106)
Creatinine, Ser: 1.06 mg/dL — ABNORMAL HIGH (ref 0.57–1.00)
Globulin, Total: 2.5 g/dL (ref 1.5–4.5)
Glucose: 88 mg/dL (ref 70–99)
Potassium: 4.4 mmol/L (ref 3.5–5.2)
Sodium: 135 mmol/L (ref 134–144)
Total Protein: 6.8 g/dL (ref 6.0–8.5)
eGFR: 61 mL/min/{1.73_m2} (ref >59)

## 2023-03-03 LAB — CBC WITH DIFFERENTIAL/PLATELET
Basophils Absolute: 0 10*3/uL (ref 0.0–0.2)
Basos: 1 %
EOS (ABSOLUTE): 0.2 10*3/uL (ref 0.0–0.4)
Eos: 4 %
Hematocrit: 39.4 % (ref 34.0–46.6)
Hemoglobin: 12.5 g/dL (ref 11.1–15.9)
Immature Grans (Abs): 0 10*3/uL (ref 0.0–0.1)
Immature Granulocytes: 0 %
Lymphocytes Absolute: 1.6 10*3/uL (ref 0.7–3.1)
Lymphs: 36 %
MCH: 26.2 pg — ABNORMAL LOW (ref 26.6–33.0)
MCHC: 31.7 g/dL (ref 31.5–35.7)
MCV: 83 fL (ref 79–97)
Monocytes Absolute: 0.3 10*3/uL (ref 0.1–0.9)
Monocytes: 8 %
Neutrophils Absolute: 2.2 10*3/uL (ref 1.4–7.0)
Neutrophils: 51 %
Platelets: 306 10*3/uL (ref 150–450)
RBC: 4.77 x10E6/uL (ref 3.77–5.28)
RDW: 14.1 % (ref 11.7–15.4)
WBC: 4.3 10*3/uL (ref 3.4–10.8)

## 2023-03-03 LAB — HCV AB W REFLEX TO QUANT PCR: HCV Ab: NONREACTIVE

## 2023-03-03 LAB — TSH: TSH: 1.66 u[IU]/mL (ref 0.450–4.500)

## 2023-03-03 LAB — LIPID PANEL
Chol/HDL Ratio: 3 {ratio} (ref 0.0–4.4)
Cholesterol, Total: 142 mg/dL (ref 100–199)
HDL: 47 mg/dL (ref >39)
LDL Chol Calc (NIH): 85 mg/dL (ref 0–99)
Triglycerides: 47 mg/dL (ref 0–149)
VLDL Cholesterol Cal: 10 mg/dL (ref 5–40)

## 2023-03-03 LAB — HIV ANTIBODY (ROUTINE TESTING W REFLEX): HIV Screen 4th Generation wRfx: NONREACTIVE

## 2023-03-03 LAB — VITAMIN B12: Vitamin B-12: 863 pg/mL (ref 232–1245)

## 2023-03-03 LAB — HCV INTERPRETATION

## 2023-03-03 LAB — HEMOGLOBIN A1C
Est. average glucose Bld gHb Est-mCnc: 128 mg/dL
Hgb A1c MFr Bld: 6.1 % — ABNORMAL HIGH (ref 4.8–5.6)

## 2023-03-10 ENCOUNTER — Telehealth: Payer: Self-pay | Admitting: Family

## 2023-03-10 NOTE — Telephone Encounter (Signed)
Patient left VM requesting her recent lab results.

## 2023-03-16 ENCOUNTER — Telehealth: Payer: Self-pay | Admitting: Family

## 2023-03-16 NOTE — Telephone Encounter (Signed)
 Patient called in inquiring about her lab results from 2 weeks ago. Please advise.

## 2023-04-11 ENCOUNTER — Ambulatory Visit: Payer: MEDICAID | Admitting: Family

## 2023-06-13 ENCOUNTER — Other Ambulatory Visit: Payer: Self-pay | Admitting: Family

## 2023-06-13 DIAGNOSIS — Z1231 Encounter for screening mammogram for malignant neoplasm of breast: Secondary | ICD-10-CM

## 2023-06-26 ENCOUNTER — Other Ambulatory Visit: Payer: Self-pay | Admitting: Family

## 2023-07-12 ENCOUNTER — Ambulatory Visit
Admission: RE | Admit: 2023-07-12 | Discharge: 2023-07-12 | Disposition: A | Discharge status: Home / Self Care | Payer: MEDICAID | Source: Ambulatory Visit | Attending: Family | Admitting: Family

## 2023-07-12 DIAGNOSIS — Z1231 Encounter for screening mammogram for malignant neoplasm of breast: Secondary | ICD-10-CM | POA: Insufficient documentation

## 2023-08-31 ENCOUNTER — Ambulatory Visit: Payer: MEDICAID | Admitting: Family

## 2023-09-14 ENCOUNTER — Other Ambulatory Visit: Payer: Self-pay | Admitting: Family

## 2023-09-14 DIAGNOSIS — I10 Essential (primary) hypertension: Secondary | ICD-10-CM

## 2023-09-14 DIAGNOSIS — E559 Vitamin D deficiency, unspecified: Secondary | ICD-10-CM

## 2023-09-14 DIAGNOSIS — E538 Deficiency of other specified B group vitamins: Secondary | ICD-10-CM

## 2023-09-14 DIAGNOSIS — F209 Schizophrenia, unspecified: Secondary | ICD-10-CM

## 2023-09-14 DIAGNOSIS — E7800 Pure hypercholesterolemia, unspecified: Secondary | ICD-10-CM

## 2023-09-14 DIAGNOSIS — R5383 Other fatigue: Secondary | ICD-10-CM

## 2023-09-14 DIAGNOSIS — R7303 Prediabetes: Secondary | ICD-10-CM

## 2023-09-25 ENCOUNTER — Other Ambulatory Visit: Payer: MEDICAID

## 2023-09-26 ENCOUNTER — Ambulatory Visit: Payer: MEDICAID | Admitting: Family

## 2023-09-26 ENCOUNTER — Encounter: Payer: Self-pay | Admitting: Family

## 2023-09-26 ENCOUNTER — Ambulatory Visit: Payer: Self-pay

## 2023-09-26 VITALS — BP 138/94 | HR 78 | Ht 63.0 in | Wt 198.4 lb

## 2023-09-26 DIAGNOSIS — E7800 Pure hypercholesterolemia, unspecified: Secondary | ICD-10-CM

## 2023-09-26 DIAGNOSIS — E66812 Obesity, class 2: Secondary | ICD-10-CM | POA: Diagnosis not present

## 2023-09-26 DIAGNOSIS — I10 Essential (primary) hypertension: Secondary | ICD-10-CM

## 2023-09-26 DIAGNOSIS — K219 Gastro-esophageal reflux disease without esophagitis: Secondary | ICD-10-CM

## 2023-09-26 DIAGNOSIS — Z6835 Body mass index (BMI) 35.0-35.9, adult: Secondary | ICD-10-CM

## 2023-09-26 LAB — HEMOGLOBIN A1C
Est. average glucose Bld gHb Est-mCnc: 128 mg/dL
Hgb A1c MFr Bld: 6.1 % — ABNORMAL HIGH (ref 4.8–5.6)

## 2023-09-26 LAB — CMP14+EGFR
ALT: 7 IU/L (ref 0–32)
AST: 12 IU/L (ref 0–40)
Albumin: 4.1 g/dL (ref 3.8–4.9)
Alkaline Phosphatase: 73 IU/L (ref 44–121)
BUN/Creatinine Ratio: 16 (ref 9–23)
BUN: 16 mg/dL (ref 6–24)
Bilirubin Total: 0.3 mg/dL (ref 0.0–1.2)
CO2: 22 mmol/L (ref 20–29)
Calcium: 9.2 mg/dL (ref 8.7–10.2)
Chloride: 100 mmol/L (ref 96–106)
Creatinine, Ser: 1.01 mg/dL — ABNORMAL HIGH (ref 0.57–1.00)
Globulin, Total: 2.4 g/dL (ref 1.5–4.5)
Glucose: 91 mg/dL (ref 70–99)
Potassium: 4.3 mmol/L (ref 3.5–5.2)
Sodium: 136 mmol/L (ref 134–144)
Total Protein: 6.5 g/dL (ref 6.0–8.5)
eGFR: 64 mL/min/1.73 (ref >59)

## 2023-09-26 LAB — CBC WITH DIFFERENTIAL/PLATELET
Basophils Absolute: 0 x10E3/uL (ref 0.0–0.2)
Basos: 1 %
EOS (ABSOLUTE): 0.2 x10E3/uL (ref 0.0–0.4)
Eos: 6 %
Hematocrit: 38.9 % (ref 34.0–46.6)
Hemoglobin: 12 g/dL (ref 11.1–15.9)
Immature Grans (Abs): 0 x10E3/uL (ref 0.0–0.1)
Immature Granulocytes: 0 %
Lymphocytes Absolute: 1.6 x10E3/uL (ref 0.7–3.1)
Lymphs: 41 %
MCH: 26 pg — ABNORMAL LOW (ref 26.6–33.0)
MCHC: 30.8 g/dL — ABNORMAL LOW (ref 31.5–35.7)
MCV: 84 fL (ref 79–97)
Monocytes Absolute: 0.4 x10E3/uL (ref 0.1–0.9)
Monocytes: 9 %
Neutrophils Absolute: 1.8 x10E3/uL (ref 1.4–7.0)
Neutrophils: 43 %
Platelets: 321 x10E3/uL (ref 150–450)
RBC: 4.62 x10E6/uL (ref 3.77–5.28)
RDW: 14.1 % (ref 11.7–15.4)
WBC: 4 x10E3/uL (ref 3.4–10.8)

## 2023-09-26 LAB — VITAMIN B12: Vitamin B-12: 700 pg/mL (ref 232–1245)

## 2023-09-26 LAB — TSH: TSH: 1.02 u[IU]/mL (ref 0.450–4.500)

## 2023-09-26 LAB — LIPID PANEL
Chol/HDL Ratio: 2.9 ratio (ref 0.0–4.4)
Cholesterol, Total: 126 mg/dL (ref 100–199)
HDL: 44 mg/dL (ref >39)
LDL Chol Calc (NIH): 70 mg/dL (ref 0–99)
Triglycerides: 54 mg/dL (ref 0–149)
VLDL Cholesterol Cal: 12 mg/dL (ref 5–40)

## 2023-09-26 LAB — VITAMIN D 25 HYDROXY (VIT D DEFICIENCY, FRACTURES): Vit D, 25-Hydroxy: 50.7 ng/mL (ref 30.0–100.0)

## 2023-09-26 NOTE — Progress Notes (Unsigned)
 Established Patient Office Visit  Subjective:  Patient ID: Kerri Clark, female    DOB: Oct 27, 1965  Age: 58 y.o. MRN: [id number]  Chief Complaint  Patient presents with   Follow-up    6 month follow up    Patient is here today for her 6 months follow up.  She has been feeling fairly well since last appointment.   She does have additional concerns to discuss today.   Has been having having pain in her right leg.  Ortho referral.  Left foot is burning sometimes. Does have some swelling in her ankles still, but the burning is on the top of her foot.   Her stress level is still very high, but she says that is starting to even out some. Her son is paralyzed and her aunt passed away unexpectedly.  Her sister is having a mental health crisis (as we discussed previously) but this is starting to get better, because she did go to the courts to try to get her some help.   She also asks if we can try weight loss meds. She has been on Psychotropic medications since the 1990's and she has gained weight.  Asks if we can look at weight loss medications for her. Blood pressure is elevated as well.   Labs were done prior to appointment, will discuss in detail today.  She needs refills.   I have reviewed her active problem list, medication list, allergies, health maintenance, notes from last encounter, lab results for her appointment today.     No other concerns at this time.   Past Medical History:  Diagnosis Date   Hypercholesterolemia    Hypertension    Schizophrenia (HCC)    Sleep apnea     Past Surgical History:  Procedure Laterality Date   COLONOSCOPY WITH PROPOFOL  N/A 11/17/2014   Procedure: COLONOSCOPY WITH PROPOFOL ;  Surgeon: Gladis RAYMOND Mariner, MD;  Location: [facility] ENDOSCOPY;  Service: Endoscopy;  Laterality: N/A;   UMBILICAL HERNIA REPAIR      Social History   Socioeconomic History   Marital status: Single    Spouse name: Not on file   Number of children: Not on file    Years of education: Not on file   Highest education level: Not on file  Occupational History   Not on file  Tobacco Use   Smoking status: Former    Current packs/day: 0.00    Types: Cigarettes    Quit date: 01/17/2003    Years since quitting: 20.7   Smokeless tobacco: Not on file  Substance and Sexual Activity   Alcohol use: No    Alcohol/week: 0.0 standard drinks of alcohol   Drug use: No   Sexual activity: Not on file  Other Topics Concern   Not on file  Social History Narrative   Not on file   Social Drivers of Health   Financial Resource Strain: Not on file  Food Insecurity: Not on file  Transportation Needs: Not on file  Physical Activity: Not on file  Stress: Not on file  Social Connections: Not on file  Intimate Partner Violence: Not on file    Family History  Problem Relation Age of Onset   Breast cancer Neg Hx     Allergies  Allergen Reactions   Atorvastatin Other (See Comments)   Rosuvastatin     Note: muscle cramps    Review of Systems  All other systems reviewed and are negative.      Objective:   BP ROLLEN)  138/94   Pulse 78   Ht 5' 3 (1.6 m)   Wt 198 lb 6.4 oz (90 kg)   LMP 11/16/2014   SpO2 98%   BMI 35.14 kg/m   Vitals:   09/26/23 1129  BP: (!) 138/94  Pulse: 78  Height: 5' 3 (1.6 m)  Weight: 198 lb 6.4 oz (90 kg)  SpO2: 98%  BMI (Calculated): 35.15    Physical Exam Vitals and nursing note reviewed.  Constitutional:      Appearance: Normal appearance. She is obese.  HENT:     Head: Normocephalic.  Eyes:     Extraocular Movements: Extraocular movements intact.     Conjunctiva/sclera: Conjunctivae normal.     Pupils: Pupils are equal, round, and reactive to light.  Cardiovascular:     Rate and Rhythm: Normal rate.  Pulmonary:     Effort: Pulmonary effort is normal.  Musculoskeletal:        General: Normal range of motion.  Neurological:     General: No focal deficit present.     Mental Status: She is alert and  oriented to person, place, and time. Mental status is at baseline.  Psychiatric:        Mood and Affect: Mood normal.        Behavior: Behavior normal.        Thought Content: Thought content normal.        Judgment: Judgment normal.      No results found for any visits on 09/26/23.  Recent Results (from the past 2160 hours)  CMP14+EGFR     Status: Abnormal   Collection Time: 09/25/23 10:23 AM  Result Value Ref Range   Glucose 91 70 - 99 mg/dL   BUN 16 6 - 24 mg/dL   Creatinine, Ser 8.98 (H) 0.57 - 1.00 mg/dL   eGFR 64 >40 fO/fpw/8.26   BUN/Creatinine Ratio 16 9 - 23   Sodium 136 134 - 144 mmol/L   Potassium 4.3 3.5 - 5.2 mmol/L   Chloride 100 96 - 106 mmol/L   CO2 22 20 - 29 mmol/L   Calcium 9.2 8.7 - 10.2 mg/dL   Total Protein 6.5 6.0 - 8.5 g/dL   Albumin 4.1 3.8 - 4.9 g/dL   Globulin, Total 2.4 1.5 - 4.5 g/dL   Bilirubin Total 0.3 0.0 - 1.2 mg/dL   Alkaline Phosphatase 73 44 - 121 IU/L    Comment: **Effective October 02, 2023 Alkaline Phosphatase**   reference interval will be changing to:              Age                Female          Female           0 -  5 days         47 - 127       47 - 127           6 - 10 days         29 - 242       29 - 242          11 - 20 days        109 - 357      109 - 357          21 - 30 days         94 - 494       94 -  494           1 -  2 months      149 - 539      149 - 539           3 -  6 months      131 - 452      131 - 452           7 - 11 months      117 - 401      117 - 401   12 months -  6 years       158 - 369      158 - 369           7 - 12 years       150 - 409      150 - 409               13 years       156 - 435       78 - 227               14 years       114 - 375       64 - 161               15 years        88 - 279       56 - 134               16 years        74 - 207       51 - 121               17 years        63 - 161       47 - 113          18 - 20 years        51 - 125       42 - 106          21 - 50 years          47 - 123       41 - 116          51 - 80 years        49 - 135       51 - 125              >80 years        48 - 129       48 - 129    AST 12 0 - 40 IU/L   ALT 7 0 - 32 IU/L  Lipid panel     Status: None   Collection Time: 09/25/23 10:23 AM  Result Value Ref Range   Cholesterol, Total 126 100 - 199 mg/dL   Triglycerides 54 0 - 149 mg/dL   HDL 44 >60 mg/dL   VLDL Cholesterol Cal 12 5 - 40 mg/dL   LDL Chol Calc (NIH) 70 0 - 99 mg/dL   Chol/HDL Ratio 2.9 0.0 - 4.4 ratio    Comment:                                   T. Chol/HDL Ratio  Men  Women                               1/2 Avg.Risk  3.4    3.3                                   Avg.Risk  5.0    4.4                                2X Avg.Risk  9.6    7.1                                3X Avg.Risk 23.4   11.0   VITAMIN D  25 Hydroxy (Vit-D Deficiency, Fractures)     Status: None   Collection Time: 09/25/23 10:23 AM  Result Value Ref Range   Vit D, 25-Hydroxy 50.7 30.0 - 100.0 ng/mL    Comment: Vitamin D  deficiency has been defined by the Institute of Medicine and an Endocrine Society practice guideline as a level of serum 25-OH vitamin D  less than 20 ng/mL (1,2). The Endocrine Society went on to further define vitamin D  insufficiency as a level between 21 and 29 ng/mL (2). 1. IOM (Institute of Medicine). 2010. Dietary reference    intakes for calcium and D. [place]: The    [employer]. 2. Holick MF, Binkley [place], Bischoff-Ferrari HA, et al.    Evaluation, treatment, and prevention of vitamin D     deficiency: an Endocrine Society clinical practice    guideline. JCEM. 2011 Jul; 96(7):1911-30.   Vitamin B12     Status: None   Collection Time: 09/25/23 10:23 AM  Result Value Ref Range   Vitamin B-12 700 232 - 1,245 pg/mL  CBC with Diff     Status: Abnormal   Collection Time: 09/25/23 10:23 AM  Result Value Ref Range   WBC 4.0 3.4 - 10.8 x10E3/uL   RBC 4.62 3.77 -  5.28 x10E6/uL   Hemoglobin 12.0 11.1 - 15.9 g/dL   Hematocrit 61.0 65.9 - 46.6 %   MCV 84 79 - 97 fL   MCH 26.0 (L) 26.6 - 33.0 pg   MCHC 30.8 (L) 31.5 - 35.7 g/dL   RDW 85.8 88.2 - 84.5 %   Platelets 321 150 - 450 x10E3/uL   Neutrophils 43 Not Estab. %   Lymphs 41 Not Estab. %   Monocytes 9 Not Estab. %   Eos 6 Not Estab. %   Basos 1 Not Estab. %   Neutrophils Absolute 1.8 1.4 - 7.0 x10E3/uL   Lymphocytes Absolute 1.6 0.7 - 3.1 x10E3/uL   Monocytes Absolute 0.4 0.1 - 0.9 x10E3/uL   EOS (ABSOLUTE) 0.2 0.0 - 0.4 x10E3/uL   Basophils Absolute 0.0 0.0 - 0.2 x10E3/uL   Immature Granulocytes 0 Not Estab. %   Immature Grans (Abs) 0.0 0.0 - 0.1 x10E3/uL  Hemoglobin A1c     Status: Abnormal   Collection Time: 09/25/23 10:23 AM  Result Value Ref Range   Hgb A1c MFr Bld 6.1 (H) 4.8 - 5.6 %    Comment:          Prediabetes: 5.7 - 6.4          Diabetes: >6.4  Glycemic control for adults with diabetes: <7.0    Est. average glucose Bld gHb Est-mCnc 128 mg/dL  TSH     Status: None   Collection Time: 09/25/23 10:23 AM  Result Value Ref Range   TSH 1.020 0.450 - 4.500 uIU/mL       Assessment & Plan:   Assessment & Plan     No follow-ups on file.   Total time spent: {AMA time spent:29001} minutes  ALAN CHRISTELLA ARRANT, FNP  09/26/2023   This document may have been prepared by [facility] Voice Recognition software and as such may include unintentional dictation errors.

## 2023-09-27 ENCOUNTER — Encounter: Payer: Self-pay | Admitting: Family

## 2023-09-27 NOTE — Assessment & Plan Note (Signed)
 Continue current therapy for lipid control. Will modify as needed based on labwork results.   -CMP w/eGFR -Lipid Panel

## 2023-09-27 NOTE — Assessment & Plan Note (Signed)
 Patient stable.  Well controlled with current therapy.   Continue current meds.

## 2023-09-27 NOTE — Assessment & Plan Note (Signed)
 Blood pressure elevated today, possibly due to rushing to get here and/or her current stress levels.  Will recheck in 2 weeks.  Continue current therapy.  Will reassess at follow up.   - CBC w/Diff - CMP w/eGFR

## 2023-10-05 ENCOUNTER — Ambulatory Visit: Payer: MEDICAID

## 2023-10-09 ENCOUNTER — Telehealth: Payer: Self-pay | Admitting: Family

## 2023-10-09 NOTE — Telephone Encounter (Signed)
Patient left VM requesting a call back. Did not state what she needs.

## 2023-10-17 ENCOUNTER — Ambulatory Visit: Payer: MEDICAID

## 2023-10-17 ENCOUNTER — Other Ambulatory Visit: Payer: Self-pay | Admitting: Family

## 2023-10-17 DIAGNOSIS — Z23 Encounter for immunization: Secondary | ICD-10-CM

## 2023-10-17 DIAGNOSIS — Z719 Counseling, unspecified: Secondary | ICD-10-CM

## 2023-10-17 MED ORDER — WEGOVY 0.5 MG/0.5ML ~~LOC~~ SOAJ
0.5000 mg | SUBCUTANEOUS | 0 refills | Status: AC
Start: 2023-10-17 — End: ?

## 2023-10-17 NOTE — Progress Notes (Signed)
 Patient seen in nurse clinic for Flu vaccine.  Flu given and tolerated well. VIS provided.  [facility] updated and copy provided.

## 2023-10-17 NOTE — Telephone Encounter (Signed)
 Patient left another VM requesting a call back but did not state what she needs.

## 2023-10-24 ENCOUNTER — Telehealth: Payer: Self-pay

## 2023-10-24 NOTE — Telephone Encounter (Signed)
 Patient LM stating that she came by last week and asked for a Prior Auth to be done for her [place]or[place], she states she was told by [employer] that she had submitted the PA but as of last week it was still not sent, pt left a rude message about nothing being done,  I checked this morning and doesn't look like anything has been done yet. I will try and submit the PA for the patient per her request, pt apologized about the message she left and how the conversation started

## 2023-10-24 NOTE — Telephone Encounter (Signed)
 Submitted PA for pt, have not gotten approval or denial yet

## 2023-10-30 ENCOUNTER — Other Ambulatory Visit: Payer: Self-pay | Admitting: Family

## 2023-10-30 DIAGNOSIS — M545 Low back pain, unspecified: Secondary | ICD-10-CM

## 2023-10-30 DIAGNOSIS — M79673 Pain in unspecified foot: Secondary | ICD-10-CM

## 2023-10-30 DIAGNOSIS — M25551 Pain in right hip: Secondary | ICD-10-CM

## 2023-10-30 DIAGNOSIS — L602 Onychogryphosis: Secondary | ICD-10-CM

## 2023-10-30 DIAGNOSIS — M25552 Pain in left hip: Secondary | ICD-10-CM

## 2023-10-30 NOTE — Progress Notes (Signed)
 po

## 2023-11-06 ENCOUNTER — Telehealth: Payer: Self-pay

## 2023-11-06 NOTE — Telephone Encounter (Signed)
 Patient LM asking about lab work from her Mental health physican, the VM was hard to understand so we will need to call back to get more information from her

## 2023-11-07 NOTE — Telephone Encounter (Signed)
 Patient called again about the labs and said something about signing a release for them? I'm not sure if she's asking about getting them done here or what, I will try calling her to find out

## 2023-11-07 NOTE — Telephone Encounter (Signed)
 Patient informed we are not able to do outside labs and she was okay with going to the off site like [facility]

## 2023-11-08 ENCOUNTER — Ambulatory Visit (INDEPENDENT_AMBULATORY_CARE_PROVIDER_SITE_OTHER): Payer: MEDICAID | Admitting: Podiatry

## 2023-11-08 ENCOUNTER — Ambulatory Visit (INDEPENDENT_AMBULATORY_CARE_PROVIDER_SITE_OTHER): Payer: MEDICAID

## 2023-11-08 ENCOUNTER — Ambulatory Visit: Payer: MEDICAID

## 2023-11-08 ENCOUNTER — Ambulatory Visit: Payer: MEDICAID | Admitting: Podiatry

## 2023-11-08 VITALS — Ht 63.0 in | Wt 198.4 lb

## 2023-11-08 DIAGNOSIS — M79672 Pain in left foot: Secondary | ICD-10-CM | POA: Diagnosis not present

## 2023-11-08 DIAGNOSIS — M76829 Posterior tibial tendinitis, unspecified leg: Secondary | ICD-10-CM

## 2023-11-08 DIAGNOSIS — M216X2 Other acquired deformities of left foot: Secondary | ICD-10-CM

## 2023-11-08 NOTE — Patient Instructions (Addendum)
 VISIT SUMMARY: Today, you were seen for numbness and tightness in your foot, particularly around the ankle, which you have been experiencing for the past month. You also mentioned occasional aching and throbbing sensations, as well as some edema and darkness around the area. Additionally, you have a callus on the right heel.  YOUR PLAN: -LEFT FOOT POSTERIOR TIBIAL TENDINITIS WITH MILD PES PLANUS: This condition involves inflammation of the posterior tibial tendon, which supports the arch of your foot, and a mild flattening of the arch. To help with this, you should continue using arch supports in your shoes, perform physical therapy exercises to strengthen the tendon, and use Voltaren gel as needed for pain relief. Improvement is expected in about two months. Additionally, moisturize your foot and use a pumice stone to manage callus formation. Monitor your symptoms and return for a follow-up if there is no improvement by Christmas.  -CALLUS OF RIGHT HEEL: A callus is a thickened and hardened part of the skin that forms due to repeated pressure and friction. To manage this, regularly moisturize the callused area, use a pumice stone to reduce its thickness, and consider using medicated corn or callus remover pads containing salicylic acid.  INSTRUCTIONS: Monitor your symptoms and return for a follow-up if there is no improvement by Christmas.                      Contains text generated by Abridge.                                 Contains text generated by Abridge.  Posterior Tibial Tendinitis  Posterior tibial tendinitis is irritation of a tendon called the posterior tibial tendon. Your posterior tibial tendon is a cord-like tissue that connects bones of your lower leg and foot to a muscle that: Supports your arch. Helps you raise up on your toes. Helps you turn your foot down and in. This condition causes foot and ankle pain. It can also lead to  a flat foot. What are the causes? This condition is most often caused by repeated stress to the tendon (overuse injury). It can also be caused by a sudden injury that stresses the tendon, such as landing on your foot after jumping or falling. What increases the risk? This condition is more likely to develop in: People who play a sport that involves putting a lot of pressure on the feet, such as: Basketball. Tennis. Soccer. Hockey. Runners. Females who are older than 60 years of age and are overweight. People with diabetes. People with decreased foot stability. People with flat feet. What are the signs or symptoms? Symptoms include: Pain in the inner ankle. Pain at the arch of your foot. Pain that gets worse with running, walking, or standing. Swelling on the inside of your ankle and foot. Weakness in your ankle or foot. Inability to stand up on tiptoe. Flattening of the arch of your foot. How is this diagnosed? This condition may be diagnosed based on: Your symptoms. Your medical history. A physical exam. Tests, such as: X-ray. MRI. Ultrasound. How is this treated? This condition may be treated by: Putting ice to the injured area. Taking NSAIDs, such as ibuprofen, to reduce pain and swelling. Wearing a special shoe or shoe insert to support your arch (orthotic). Having physical therapy. Replacing high-impact exercise with low-impact exercise, such as swimming or cycling. If your symptoms do not improve with these treatments,  you may need to wear a splint, removable walking boot, or short leg cast for 6-8 weeks to keep your foot and ankle still (immobilized). Follow these instructions at home: If you have a cast, splint, or boot: Keep it clean and dry. Check the skin around it every day. Tell your health care provider about any concerns. If you have a cast: Do not stick anything inside it to scratch your skin. Doing that increases your risk of infection. You may put  lotion on dry skin around the edges of the cast. Do not put lotion on the skin underneath the cast. If you have a splint or boot: Wear it as told by your health care provider. Remove it only as told by your health care provider. Loosen it if your toes tingle, become numb, or turn cold and blue. Bathing Do not take baths, swim, or use a hot tub until your health care provider approves. Ask your health care provider if you may take showers. If your cast, splint, or boot is not waterproof: Do not let it get wet. Cover it with a waterproof covering while you take a bath or a shower. Managing pain and swelling   If directed, put ice on the injured area. If you have a removable splint or boot, remove it as told by your health care provider. Put ice in a plastic bag. Place a towel between your skin and the bag or between your cast and the bag. Leave the ice on for 20 minutes, 2-3 times a day. Move your toes often to reduce stiffness and swelling. Raise (elevate) the injured area above the level of your heart while you are sitting or lying down. Activity Do not use the injured foot to support your body weight until your health care provider says that you can. Use crutches as told by your health care provider. Do not do activities that make pain or swelling worse. Ask your health care provider when it is safe to drive if you have a cast, splint, or boot on your foot. Return to your normal activities as told by your health care provider. Ask your health care provider what activities are safe for you. Do exercises as told by your health care provider. General instructions Take over-the-counter and prescription medicines only as told by your health care provider. If you have an orthotic, use it as told by your health care provider. Keep all follow-up visits as told by your health care provider. This is important. How is this prevented? Wear footwear that is appropriate to your athletic  activity. Avoid athletic activities that cause pain or swelling in your ankle or foot. Before being active, do range-of-motion and stretching exercises. If you develop pain or swelling while training, stop training. If you have pain or swelling that does not improve after a few days of rest, see your health care provider. If you start a new athletic activity, start gradually so you can build up your strength and flexibility. Contact a health care provider if: Your symptoms get worse. Your symptoms do not improve in 6-8 weeks. You develop new, unexplained symptoms. Your splint, boot, or cast gets damaged. Summary Posterior tibial tendinitis is irritation of a tendon called the posterior tibial tendon. This condition is most often caused by repeated stress to the tendon (overuse injury). This condition causes foot pain and ankle pain. It can also lead to a flat foot. This condition may be treated by not doing high-impact activities, applying ice, having physical therapy,  wearing orthotics, and wearing a cast, splint, or boot if needed. This information is not intended to replace advice given to you by your health care provider. Make sure you discuss any questions you have with your health care provider. Document Revised: 05/01/2018 Document Reviewed: 03/08/2018 Elsevier Patient Education  2020 [employer]  Posterior Tibial Tendinitis Rehab Ask your health care provider which exercises are safe for you. Do exercises exactly as told by your health care provider and adjust them as directed. It is normal to feel mild stretching, pulling, tightness, or discomfort as you do these exercises. Stop right away if you feel sudden pain or your pain gets worse. Do not begin these exercises until told by your health care provider. Stretching and range-of-motion exercises These exercises warm up your muscles and joints and improve the movement and flexibility in your ankle and foot. These exercises may also  help to relieve pain. Standing wall calf stretch, knee straight   Stand with your hands against a wall. Extend your left / right leg behind you, and bend your front knee slightly. If directed, place a folded washcloth under the arch of your foot for support. Point the toes of your back foot slightly inward. Keeping your heels on the floor and your back knee straight, shift your weight toward the wall. Do not allow your back to arch. You should feel a gentle stretch in your upper left / right calf. Hold this position for 10 seconds. Repeat 10 times. Complete this exercise 2 times a day. Standing wall calf stretch, knee bent Stand with your hands against a wall. Extend your left / right leg behind you, and bend your front knee slightly. If directed, place a folded washcloth under the arch of your foot for support. Point the toes of your back foot slightly inward. Unlock your back knee so it is bent. Keep your heels on the floor. You should feel a gentle stretch deep in your lower left / right calf. Hold this position for 10 seconds. Repeat 10 times. Complete this exercise 2 times a day. Strengthening exercises These exercises build strength and endurance in your ankle and foot. Endurance is the ability to use your muscles for a long time, even after they get tired. Ankle inversion with band Secure one end of a rubber exercise band or tubing to a fixed object, such as a table leg or a pole, that will stay still when the band is pulled. Loop the other end of the band around the middle of your left / right foot. Sit on the floor facing the object with your left / right leg extended. The band or tube should be slightly tense when your foot is relaxed. Leading with your big toe, slowly bring your left / right foot and ankle inward, toward your other foot (inversion). Hold this position for 10 seconds. Slowly return your foot to the starting position. Repeat 10 times. Complete this exercise 2 times  a day. Towel curls   Sit in a chair on a non-carpeted surface, and put your feet on the floor. Place a towel in front of your feet. Keeping your heel on the floor, put your left / right foot on the towel. Pull the towel toward you by grabbing the towel with your toes and curling them under. Keep your heel on the floor while you do this. Let your toes relax. Grab the towel with your toes again. Keep going until the towel is completely underneath your foot. Repeat 10  times. Complete this exercise 2 times a day. Balance exercise This exercise improves or maintains your balance. Balance is important in preventing falls. Single leg stand Without wearing shoes, stand near a railing or in a doorway. You may hold on to the railing or door frame as needed for balance. Stand on your left / right foot. Keep your big toe down on the floor and try to keep your arch lifted. If balancing in this position is too easy, try the exercise with your eyes closed or while standing on a pillow. Hold this position for 10 seconds. Repeat 10 times. Complete this exercise 2 times a day. This information is not intended to replace advice given to you by your health care provider. Make sure you discuss any questions you have with your health care provider.

## 2023-11-09 ENCOUNTER — Ambulatory Visit (LOCAL_COMMUNITY_HEALTH_CENTER): Payer: MEDICAID

## 2023-11-09 DIAGNOSIS — Z719 Counseling, unspecified: Secondary | ICD-10-CM

## 2023-11-09 DIAGNOSIS — Z23 Encounter for immunization: Secondary | ICD-10-CM

## 2023-11-09 NOTE — Progress Notes (Signed)
 In nurse clinic for covid vaccine. Tolerated vaccine well R.delt. Pt meets  Covid vaccine CDC guidelines/per S.O. for high risk  severe Covid-19. VIS given and copy of [facility].

## 2023-11-12 NOTE — Progress Notes (Signed)
 Subjective:  Patient ID: Kerri Clark, female    DOB: February 18, 1965,  MRN: [id number]  Chief Complaint  Patient presents with   Nail Problem    RM 4 New Patient is here for pain of the left foot lateral side.      Discussed the use of AI scribe software for clinical note transcription with the patient, who gave verbal consent to proceed.  History of Present Illness Kerri Clark is a 58 year old female who presents with numbness and tightness in her foot.  She has been experiencing numbness and a tight sensation in her foot, particularly around the ankle, for the past month. There is no history of recent injury or fall. She does not experience true pain but describes occasional aching and throbbing sensations without any shooting pain. Numbness or tingling is also noted on the bottom of the foot at times.  She notes the presence of edema, which is more pronounced in one foot compared to the other, and mentions darkness around the area, possibly due to compression socks.  She has a family history of foot issues, with her sister and aunt both having undergone surgery to remove a bone, raising her concern about a potential hereditary condition.  She uses arch supports in her shoes, purchased from [employer], to provide extra support. Although she walks for exercise, it is not as consistent as she would like. She has previously used a generic form of Voltaren gel for arthritis in her hand but has not applied it to her foot.      Objective:    Physical Exam VASCULAR: DP and PT pulse palpable. Foot is warm and well-perfused. Capillary fill time is brisk. DERMATOLOGIC: Normal skin turgor, texture, and temperature. No open lesions, rashes, or ulcerations. Callus on the plantar right heel. NEUROLOGIC: Normal sensation to light touch and pressure. No paresthesias on examination. ORTHOPEDIC: Smooth pain-free range of motion of all examined joints. No ecchymosis or bruising. No gross  deformity. No pain to palpation. Mild tenderness on the posterior tibial tendon. Mild pes planus deformity. EXTREMITIES: No evidence of tarsal tunnel syndrome or Tinel's sign.   No images are attached to the encounter.    Results RADIOLOGY Foot X-ray: No accessory ossicles; overall normal appearance   Assessment:   1. Pronation deformity of left foot   2. PTTD (posterior tibial tendon dysfunction)      Plan:  Patient was evaluated and treated and all questions answered.  Assessment and Plan Assessment & Plan Left foot posterior tibial tendinitis with mild pes planus Presents with numbness and tightness in the left foot, with mild tenderness on the posterior tibial tendon and mild pes planus deformity. No evidence of tarsal tunnel syndrome or tunnel sign. Likely due to tendinitis of the posterior tibial tendon, which supports the arch of the foot. Arch supports are beneficial. Improvement expected with physical therapy and exercises. Voltaren gel can be used for symptomatic relief, but she prefers not to rely on it. Improvement expected in approximately two months. - Provide physical therapy exercises to strengthen the posterior tibial tendon. - Advise use of arch supports in shoes. - Recommend use of Voltaren gel as needed for pain relief. - Advise moisturizing the foot and using a pumice stone to manage callus formation. - Instruct to monitor symptoms and return for follow-up if not improved by Christmas.  Callus of right heel Callus on the plantar aspect of the right heel. Compression socks may contribute to skin irritation and staining. Likely  due to mechanical pressure and friction. - Advise moisturizing the callused area regularly. - Recommend using a pumice stone to reduce callus thickness. - Suggest trying medicated corn or callus remover pads containing salicylic acid.      Return if symptoms worsen or fail to improve.

## 2023-12-20 ENCOUNTER — Other Ambulatory Visit: Payer: Self-pay | Admitting: Family

## 2024-01-03 ENCOUNTER — Ambulatory Visit: Payer: MEDICAID | Admitting: Nurse Practitioner

## 2024-01-03 ENCOUNTER — Encounter: Payer: Self-pay | Admitting: Nurse Practitioner

## 2024-01-03 VITALS — BP 136/80 | HR 82 | Temp 98.3°F | Ht 63.0 in | Wt 200.0 lb

## 2024-01-03 DIAGNOSIS — I890 Lymphedema, not elsewhere classified: Secondary | ICD-10-CM

## 2024-01-03 DIAGNOSIS — E66812 Obesity, class 2: Secondary | ICD-10-CM | POA: Insufficient documentation

## 2024-01-03 DIAGNOSIS — Z6835 Body mass index (BMI) 35.0-35.9, adult: Secondary | ICD-10-CM | POA: Diagnosis not present

## 2024-01-03 DIAGNOSIS — I10 Essential (primary) hypertension: Secondary | ICD-10-CM

## 2024-01-03 DIAGNOSIS — E7800 Pure hypercholesterolemia, unspecified: Secondary | ICD-10-CM | POA: Diagnosis not present

## 2024-01-03 DIAGNOSIS — G4733 Obstructive sleep apnea (adult) (pediatric): Secondary | ICD-10-CM | POA: Diagnosis not present

## 2024-01-03 DIAGNOSIS — F209 Schizophrenia, unspecified: Secondary | ICD-10-CM

## 2024-01-03 DIAGNOSIS — K219 Gastro-esophageal reflux disease without esophagitis: Secondary | ICD-10-CM

## 2024-01-03 MED ORDER — OMEPRAZOLE 20 MG PO CPDR: 20.0000 mg | DELAYED_RELEASE_CAPSULE | Freq: Every day | ORAL | 1 refills | Status: AC

## 2024-01-03 NOTE — Progress Notes (Signed)
 BP 136/80   Pulse 82   Temp 98.3 F (36.8 C)   Ht 5' 3 (1.6 m)   Wt 200 lb (90.7 kg)   LMP 11/16/2014   SpO2 98%   BMI 35.43 kg/m    Subjective:    Patient ID: Kerri Clark, female    DOB: [birth year], 58 y.o.   MRN: [id number]  HPI: Kerri Clark is a 58 y.o. female  Chief Complaint  Patient presents with   Establish Care   Discussed the use of AI scribe software for clinical note transcription with the patient, who gave verbal consent to proceed.  History of Present Illness Kerri Clark is a 58 year old female who presents to establish care.  Hypertension - Currently taking lisinopril  20 mg daily - Previously took amlodipine, discontinued after personal research BP Readings from Last 3 Encounters:  01/03/24 136/80  09/26/23 (!) 138/94  04/11/23 (!) 140/86     Hypercholesterolemia - Managed with Nexlizet  180-10 mg daily - Recent LDL improved to 70 - Initially attempted non-pharmacologic management but resumed medication after consultation  Schizophrenia and psychotropic medication side effects - On Latuda 80 mg daily since the 1990s - Experiencing weight gain as a side effect  Obesity and weight management - Current weight 200 pounds, BMI 35.43 - Previously prescribed Wegovy  for weight management, discontinued due to insurance coverage issues - Concerned about weight and seeking affordable medication options - Encourage continuation of lifestyle modifications, including dietary management and regular exercise. -continue to increase physical activity, getting at least 150 min of physical activity a week.  Work on including runner, broadcasting/film/video 2 days a week.  - continue eating at a calorie deficit 1600-1700 cal a day, eating a well balanced diet with whole foods, avoiding processed foods.   Patient is motivated to continue working on lifestyle modification.    Sleep apnea - Diagnosed with sleep apnea - Not using CPAP machine for several years;  machine is stored away  Gastroesophageal reflux disease (gerd) and appetite changes - Managed with omeprazole  20 mg daily; due for refill - Experiencing cravings for food shortly after eating, which is unusual  Metabolic and laboratory findings - Recent blood work (September) showed normal thyroid function - A1c previously in prediabetic range at 6.1, improved to 5.8 - Normal CBC, B12, and vitamin D  levels - Normal kidney and liver function  Substance use history - Quit smoking in 2004 - Abstains from alcohol due to past substance use concerns and to avoid relapse into smoking - [employer] and avoids alcohol         01/03/2024    1:40 PM 03/02/2023    9:48 AM 06/03/2021    1:28 PM  Depression screen PHQ 2/9  Decreased Interest 2 1 0  Down, Depressed, Hopeless 1 1 0  PHQ - 2 Score 3 2 0  Altered sleeping 1 1   Tired, decreased energy 2 1   Change in appetite 1 0   Feeling bad or failure about yourself  1 0   Trouble concentrating 0 0   Moving slowly or fidgety/restless 0 0   Suicidal thoughts 0 0   PHQ-9 Score 8 4    Difficult doing work/chores Somewhat difficult       Data saved with a previous flowsheet row definition    Relevant past medical, surgical, family and social history reviewed and updated as indicated. Interim medical history since our last visit reviewed. Allergies and medications reviewed and updated.  Review of Systems  Per HPI unless specifically indicated above     Objective:      BP 136/80   Pulse 82   Temp 98.3 F (36.8 C)   Ht 5' 3 (1.6 m)   Wt 200 lb (90.7 kg)   LMP 11/16/2014   SpO2 98%   BMI 35.43 kg/m    Wt Readings from Last 3 Encounters:  01/03/24 200 lb (90.7 kg)  11/08/23 198 lb 6.4 oz (90 kg)  09/26/23 198 lb 6.4 oz (90 kg)    Physical Exam VITALS: P- 86 MEASUREMENTS: Weight- 200, BMI- 35.43. GENERAL: Alert, cooperative, well developed, no acute distress. HEENT: Normocephalic, normal oropharynx, moist mucous  membranes. CHEST: Clear to auscultation bilaterally, no wheezes, rhonchi, or crackles. CARDIOVASCULAR: Normal heart rate and rhythm, S1 and S2 normal without murmurs. ABDOMEN: Soft, non-tender, non-distended, without organomegaly, normal bowel sounds. EXTREMITIES: No cyanosis or edema. NEUROLOGICAL: Cranial nerves grossly intact, moves all extremities without gross motor or sensory deficit.  Results for orders placed or performed in visit on 09/14/23  CMP14+EGFR   Collection Time: 09/25/23 10:23 AM  Result Value Ref Range   Glucose 91 70 - 99 mg/dL   BUN 16 6 - 24 mg/dL   Creatinine, Ser 8.98 (H) 0.57 - 1.00 mg/dL   eGFR 64 >40 fO/fpw/8.26   BUN/Creatinine Ratio 16 9 - 23   Sodium 136 134 - 144 mmol/L   Potassium 4.3 3.5 - 5.2 mmol/L   Chloride 100 96 - 106 mmol/L   CO2 22 20 - 29 mmol/L   Calcium 9.2 8.7 - 10.2 mg/dL   Total Protein 6.5 6.0 - 8.5 g/dL   Albumin 4.1 3.8 - 4.9 g/dL   Globulin, Total 2.4 1.5 - 4.5 g/dL   Bilirubin Total 0.3 0.0 - 1.2 mg/dL   Alkaline Phosphatase 73 44 - 121 IU/L   AST 12 0 - 40 IU/L   ALT 7 0 - 32 IU/L  Lipid panel   Collection Time: 09/25/23 10:23 AM  Result Value Ref Range   Cholesterol, Total 126 100 - 199 mg/dL   Triglycerides 54 0 - 149 mg/dL   HDL 44 >60 mg/dL   VLDL Cholesterol Cal 12 5 - 40 mg/dL   LDL Chol Calc (NIH) 70 0 - 99 mg/dL   Chol/HDL Ratio 2.9 0.0 - 4.4 ratio  VITAMIN D  25 Hydroxy (Vit-D Deficiency, Fractures)   Collection Time: 09/25/23 10:23 AM  Result Value Ref Range   Vit D, 25-Hydroxy 50.7 30.0 - 100.0 ng/mL  Vitamin B12   Collection Time: 09/25/23 10:23 AM  Result Value Ref Range   Vitamin B-12 700 232 - 1,245 pg/mL  CBC with Diff   Collection Time: 09/25/23 10:23 AM  Result Value Ref Range   WBC 4.0 3.4 - 10.8 x10E3/uL   RBC 4.62 3.77 - 5.28 x10E6/uL   Hemoglobin 12.0 11.1 - 15.9 g/dL   Hematocrit 61.0 65.9 - 46.6 %   MCV 84 79 - 97 fL   MCH 26.0 (L) 26.6 - 33.0 pg   MCHC 30.8 (L) 31.5 - 35.7 g/dL   RDW  85.8 88.2 - 84.5 %   Platelets 321 150 - 450 x10E3/uL   Neutrophils 43 Not Estab. %   Lymphs 41 Not Estab. %   Monocytes 9 Not Estab. %   Eos 6 Not Estab. %   Basos 1 Not Estab. %   Neutrophils Absolute 1.8 1.4 - 7.0 x10E3/uL   Lymphocytes Absolute 1.6 0.7 - 3.1  x10E3/uL   Monocytes Absolute 0.4 0.1 - 0.9 x10E3/uL   EOS (ABSOLUTE) 0.2 0.0 - 0.4 x10E3/uL   Basophils Absolute 0.0 0.0 - 0.2 x10E3/uL   Immature Granulocytes 0 Not Estab. %   Immature Grans (Abs) 0.0 0.0 - 0.1 x10E3/uL  Hemoglobin A1c   Collection Time: 09/25/23 10:23 AM  Result Value Ref Range   Hgb A1c MFr Bld 6.1 (H) 4.8 - 5.6 %   Est. average glucose Bld gHb Est-mCnc 128 mg/dL  TSH   Collection Time: 09/25/23 10:23 AM  Result Value Ref Range   TSH 1.020 0.450 - 4.500 uIU/mL          Assessment & Plan:   Problem List Items Addressed This Visit       Cardiovascular and Mediastinum   Essential (primary) hypertension - Primary     Respiratory   Apnea, sleep     Digestive   GERD (gastroesophageal reflux disease)   Relevant Medications   omeprazole  (PRILOSEC) 20 MG capsule     Other   Schizophrenia (HCC)   Pure hypercholesterolemia   Lymphedema   Class 2 severe obesity due to excess calories with serious comorbidity and body mass index (BMI) of 35.0 to 35.9 in adult     Assessment and Plan Assessment & Plan Essential hypertension Hypertension managed with lisinopril . Blood pressure was elevated during a previous visit but normalized after rest. She has not been taking lisinopril  regularly. - Rechecked blood pressure before leaving the clinic - Continue lisinopril  20 mg daily  Obstructive sleep apnea CPAP machine not in use for years. No current treatment for sleep apnea.  Gastroesophageal reflux disease GERD managed with omeprazole . Patient mentioned a red spot on her skin that caused concern about cancer, and she also mentioned experiencing cravings for food after eating, which she didn't know  the cause of. - Refilled omeprazole  20 mg daily with a 90-day supply  Lymphedema Patient was seen by vascular for lymphedema.  Pure hypercholesterolemia Cholesterol levels improved with Nexlizet . LDL is at 70. She was previously hesitant to take cholesterol medication due to online information but is now on Nexlizet . - Continue Nexlizet  180/10 mg daily  Class 2 obesity BMI of 35.43. Previously on Wegovy  but discontinued due to insurance coverage issues. Experiencing cravings post-meal, unsure of cause. A1c improved from 6.1 to 5.8, indicating better glycemic control. - Discussed potential for oral Wegovy  in January, which may be more affordable  General Health Maintenance Up to date on mammogram and colorectal cancer screening. Due for both next year. - Continue routine screenings as scheduled        Follow up plan: Return in about 6 months (around 07/03/2024) for follow up with labs.

## 2024-01-16 ENCOUNTER — Telehealth: Payer: Self-pay

## 2024-01-16 ENCOUNTER — Other Ambulatory Visit: Payer: Self-pay | Admitting: Nurse Practitioner

## 2024-01-16 ENCOUNTER — Telehealth: Payer: Self-pay | Admitting: Pharmacy Technician

## 2024-01-16 ENCOUNTER — Other Ambulatory Visit (HOSPITAL_COMMUNITY): Payer: Self-pay

## 2024-01-16 DIAGNOSIS — G4733 Obstructive sleep apnea (adult) (pediatric): Secondary | ICD-10-CM

## 2024-01-16 DIAGNOSIS — E66812 Obesity, class 2: Secondary | ICD-10-CM

## 2024-01-16 MED ORDER — ZEPBOUND 2.5 MG/0.5ML ~~LOC~~ SOAJ: 2.5000 mg | SUBCUTANEOUS | 0 refills | Status: AC

## 2024-01-16 NOTE — Telephone Encounter (Signed)
 Can you write to see if ins will cover zepbound?

## 2024-01-16 NOTE — Telephone Encounter (Signed)
Can we start PA?

## 2024-01-16 NOTE — Telephone Encounter (Signed)
 Copied from CRM [id number]. Topic: Clinical - Medication Question >> Jan 16, 2024 11:40 AM Everette C wrote: Reason for CRM: The patient would like to discuss possible alternative prescriptions for Wegovy  that are either cost effective or able to be covered under the criteria of being pre diabetic. Please contact the patient further if/when possible

## 2024-01-16 NOTE — Telephone Encounter (Signed)
 I reach out to Young prior social research officer, government and told her what brian said about medicaid.  She is still running and this is the message she gets.  She said it could change as of Jan 1?

## 2024-01-16 NOTE — Telephone Encounter (Signed)
 Pharmacy Patient Advocate Encounter   Received notification from CoverMyMeds that prior authorization for Zepbound 2.5MG /0.5ML pen-injectors is required/requested.   Insurance verification completed.   The patient is insured through [employer].   Per test claim: Effective October 1st, Medicaid discontinued coverage of GLP1 medications for weight loss (such as Wegovy  and Zepbound), unless the patient has a documented history of a heart attack or stroke. Zepbound will continue to be covered only for patients with moderate to severe sleep apnea (AHI 15-30) and a BMI greater than 40. Because of this change, the prior authorization team will not be submitting new PA requests for GLP1 medications prescribed for weight loss, as patients will be unable to continue therapy under Medicaid coverage.  **The criteria is not met, the patient has to have a BMI of 40 or greater and we need a sleep study to submit along with the PA showing her AHI 15 or greater**

## 2024-01-17 NOTE — Telephone Encounter (Signed)
 Good morning Kerri Clark, we will be submitting GLP scripts to medicaid plans starting 01/19/24 and I will submit hers then.

## 2024-01-31 ENCOUNTER — Telehealth: Payer: Self-pay

## 2024-01-31 NOTE — Telephone Encounter (Signed)
 Any other options willing to give?

## 2024-01-31 NOTE — Telephone Encounter (Signed)
 Copied from CRM [id number]. Topic: Clinical - Medical Advice >> Jan 31, 2024 11:05 AM Gustabo D wrote:  The patient would like to discuss possible alternative prescriptions for Wegovy  that are either cost effective or able to be covered under the criteria of being pre diabetic. Please contact the patient further if/when possible. Pt called on 01/16/24 and hasn't gotten a call back yet

## 2024-01-31 NOTE — Telephone Encounter (Signed)
 Per julie the other option is wegovy  in oral pill form, most likely insurance will not cover that as well but can be more affordable self pay.  Please check out the wegovy  website for the new oral pill form and pricing and let us  know if interested and we can send in RX.  Thanks

## 2024-02-23 ENCOUNTER — Other Ambulatory Visit: Payer: Self-pay | Admitting: Family

## 2024-03-26 ENCOUNTER — Ambulatory Visit: Payer: MEDICAID | Admitting: Family

## 2024-05-04 IMAGING — MG MM DIGITAL SCREENING BILAT W/ TOMO AND CAD
8 series · 8 of 24 positions shown · non-contrast
Comparison: Previous exam(s).

CLINICAL DATA: Screening.

EXAM:
DIGITAL SCREENING BILATERAL MAMMOGRAM WITH TOMOSYNTHESIS AND CAD
TECHNIQUE: Bilateral screening digital craniocaudal and mediolateral oblique
mammograms were obtained. Bilateral screening digital breast
tomosynthesis was performed. The images were evaluated with
computer-aided detection.

[R CC synth-2D]
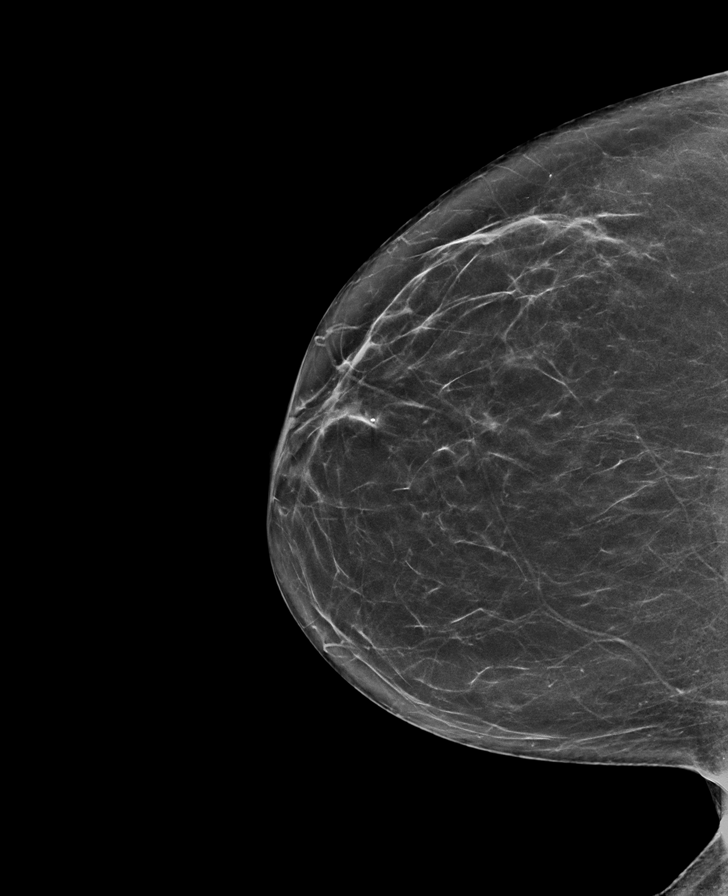

[L CC synth-2D]
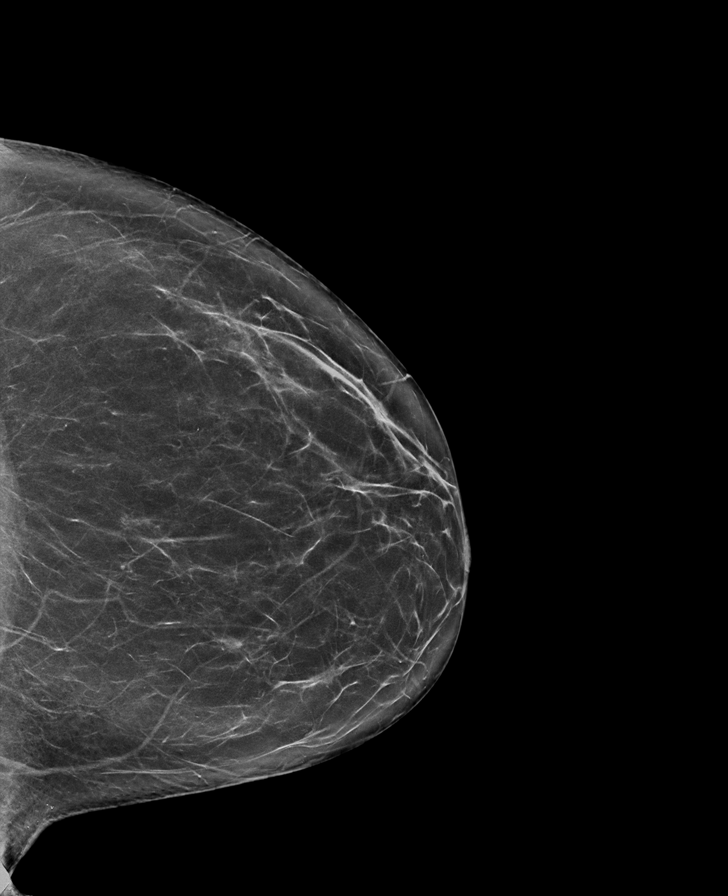

[R MLO synth-2D]
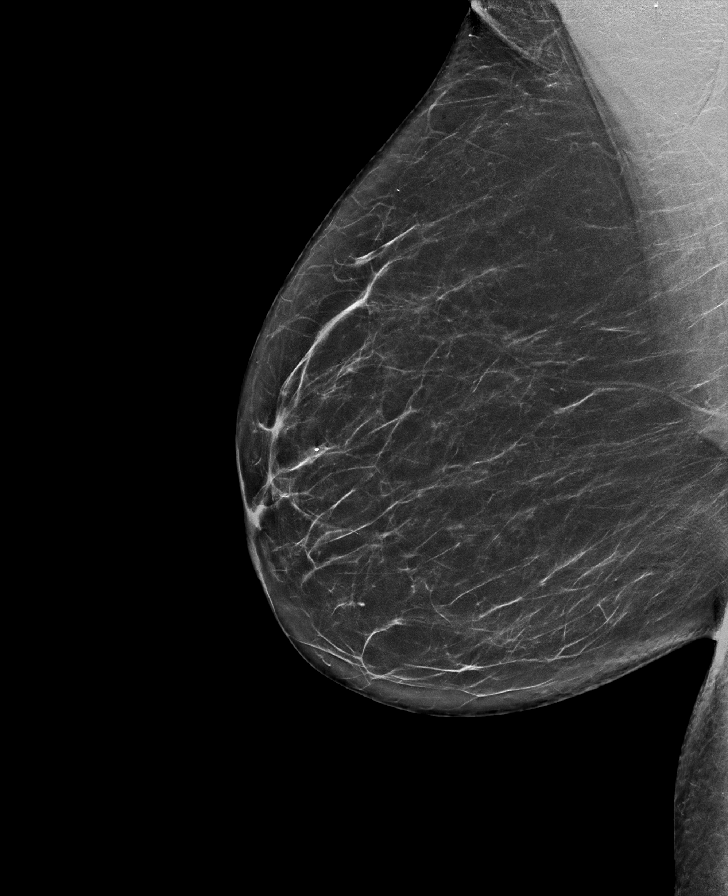

[L MLO synth-2D]
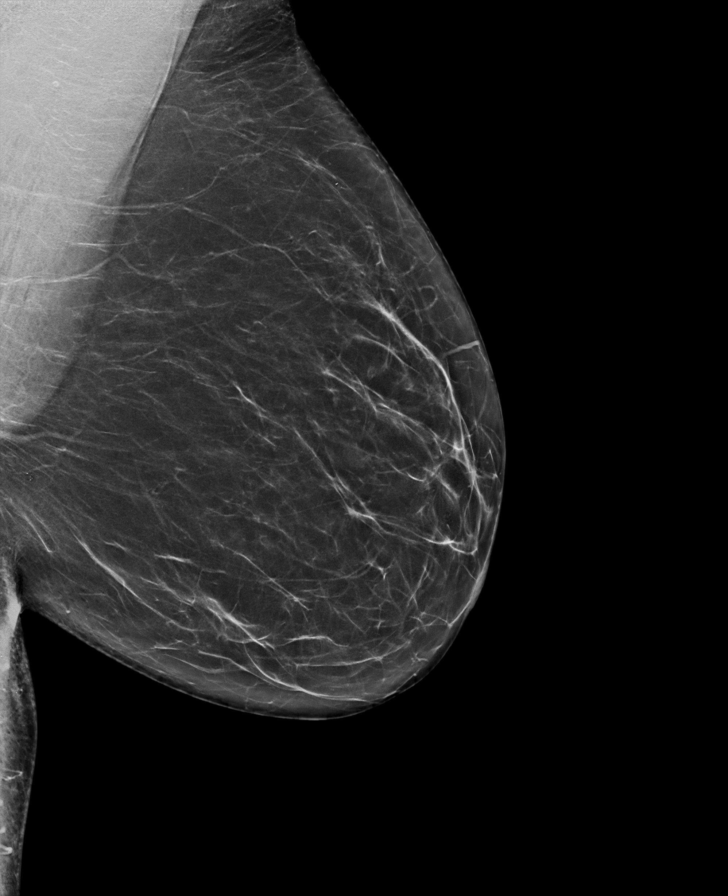

[L CC tomo · tomo slice 37/72.0]
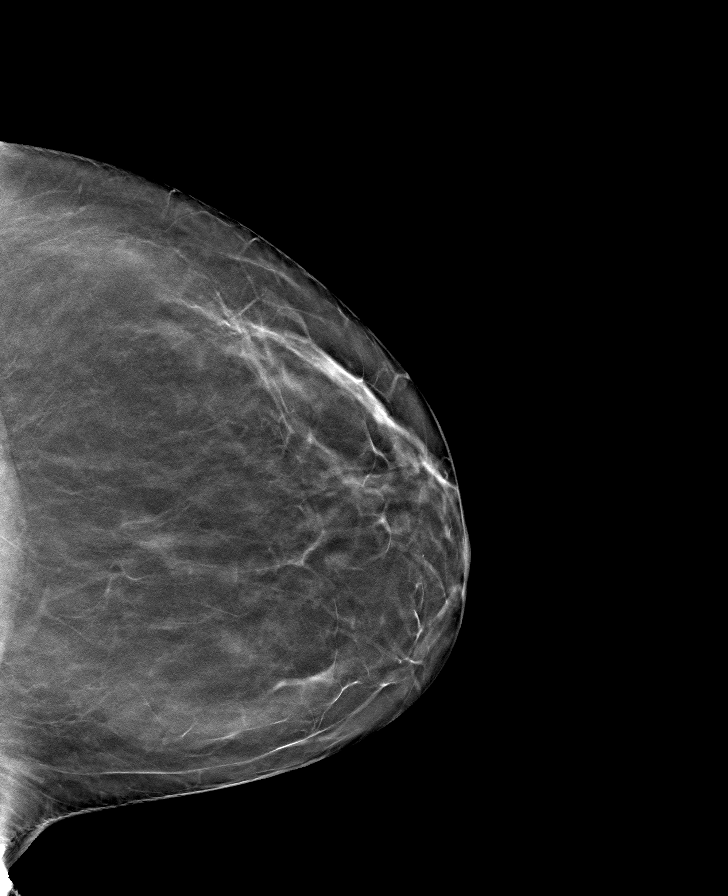

[R CC tomo · tomo slice 35/69.0]
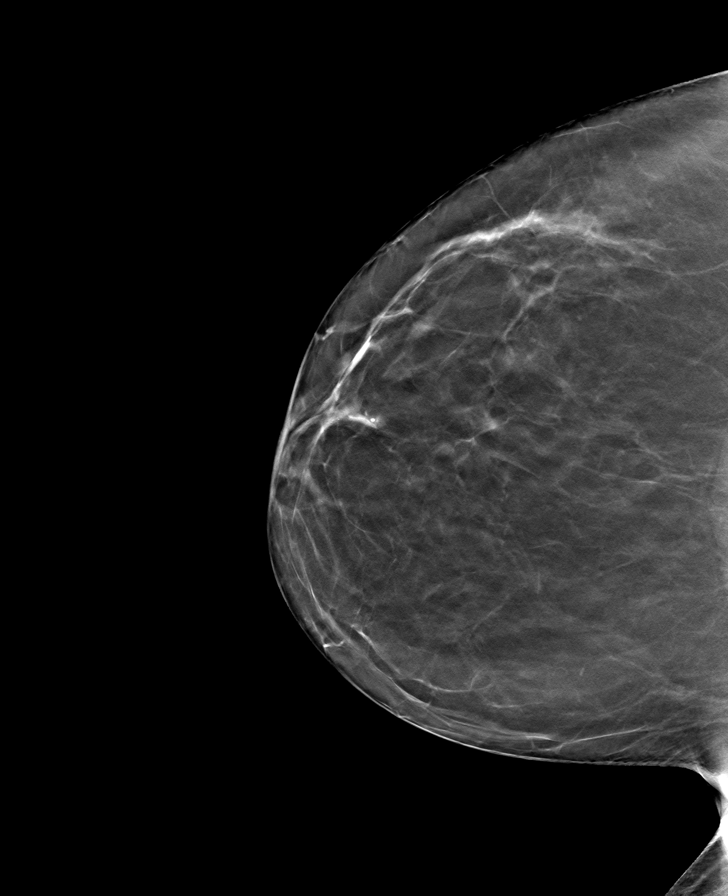

[L MLO tomo · tomo slice 38/75.0]
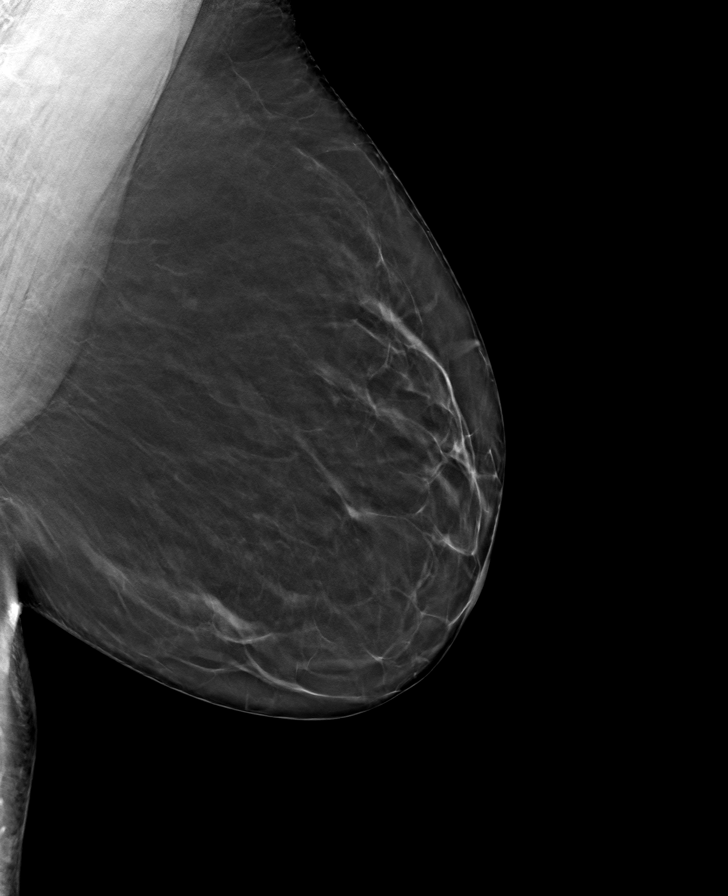

[R MLO tomo · tomo slice 39/77.0]
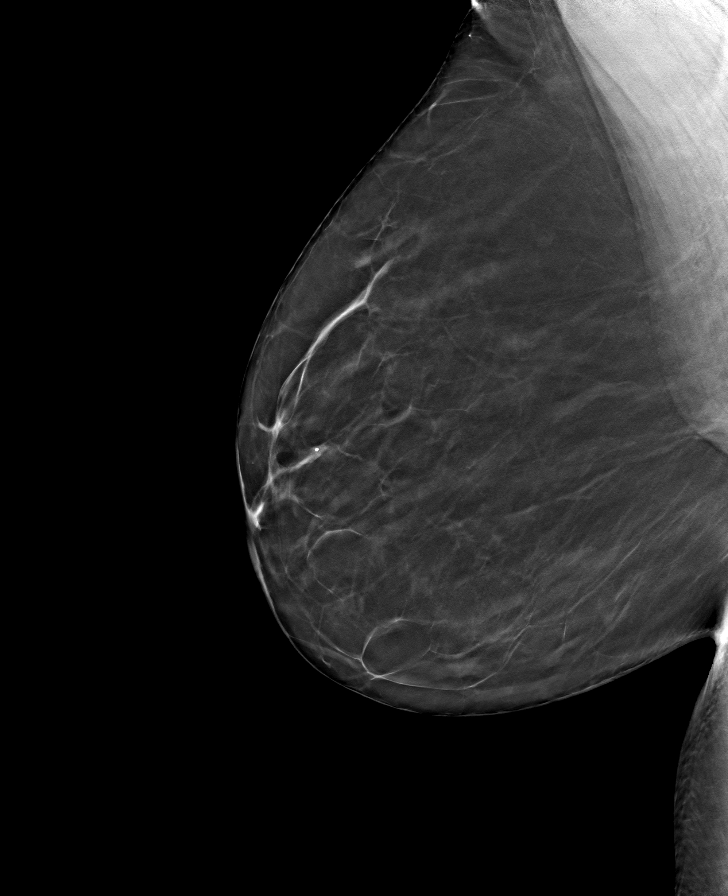

[8 of 24 positions shown; findings below may reference images not displayed]

ACR Breast Density Category b: There are scattered areas of
fibroglandular density.
FINDINGS: There are no findings suspicious for malignancy.
IMPRESSION: No mammographic evidence of malignancy. A result letter of this
screening mammogram will be mailed directly to the patient.

RECOMMENDATION:
Screening mammogram in one year. (Code:51-O-LD2)

BI-RADS CATEGORY  1: Negative.

## 2024-07-04 ENCOUNTER — Ambulatory Visit: Payer: MEDICAID | Admitting: Nurse Practitioner
